# Patient Record
Sex: Male | Born: 1985 | Race: Black or African American | Hispanic: No | Marital: Married | Smoking: Never smoker
Health system: Southern US, Community
[De-identification: ages and names within clinical notes are randomized; demographics above are authoritative.]

## PROBLEM LIST (undated history)

## (undated) DIAGNOSIS — F4310 Post-traumatic stress disorder, unspecified: Secondary | ICD-10-CM

## (undated) DIAGNOSIS — F909 Attention-deficit hyperactivity disorder, unspecified type: Secondary | ICD-10-CM

## (undated) DIAGNOSIS — E785 Hyperlipidemia, unspecified: Secondary | ICD-10-CM

---

## 2007-01-04 ENCOUNTER — Emergency Department: Payer: Self-pay | Admitting: Internal Medicine

## 2007-05-11 ENCOUNTER — Emergency Department: Payer: Self-pay | Admitting: Internal Medicine

## 2011-12-08 ENCOUNTER — Emergency Department: Payer: Self-pay | Admitting: *Deleted

## 2013-10-02 ENCOUNTER — Ambulatory Visit: Payer: Self-pay | Admitting: Adult Health

## 2013-10-16 ENCOUNTER — Ambulatory Visit: Payer: Self-pay | Admitting: Adult Health

## 2014-01-29 DIAGNOSIS — F988 Other specified behavioral and emotional disorders with onset usually occurring in childhood and adolescence: Secondary | ICD-10-CM | POA: Insufficient documentation

## 2014-04-15 DIAGNOSIS — F411 Generalized anxiety disorder: Secondary | ICD-10-CM | POA: Insufficient documentation

## 2015-04-21 DIAGNOSIS — E7800 Pure hypercholesterolemia, unspecified: Secondary | ICD-10-CM | POA: Insufficient documentation

## 2018-04-25 ENCOUNTER — Other Ambulatory Visit: Payer: Self-pay | Admitting: Orthopedic Surgery

## 2018-04-25 DIAGNOSIS — M25512 Pain in left shoulder: Secondary | ICD-10-CM

## 2018-05-09 ENCOUNTER — Ambulatory Visit
Admission: RE | Admit: 2018-05-09 | Discharge: 2018-05-09 | Disposition: A | Discharge status: Home / Self Care | Payer: Commercial Managed Care - PPO | Source: Ambulatory Visit | Attending: Orthopedic Surgery | Admitting: Orthopedic Surgery

## 2018-05-09 DIAGNOSIS — M25512 Pain in left shoulder: Secondary | ICD-10-CM

## 2018-05-09 MED ORDER — LIDOCAINE HCL (PF) 1 % IJ SOLN
30.0000 mL | Freq: Once | INTRAMUSCULAR | Status: AC
  Administered 2018-05-09: 5 mL via INTRADERMAL
  Filled 2018-05-09: qty 30

## 2018-05-09 MED ORDER — GADOBENATE DIMEGLUMINE 529 MG/ML IV SOLN
5.0000 mL | Freq: Once | INTRAVENOUS | Status: AC | PRN
  Administered 2018-05-09: 0.1 mL via INTRA_ARTICULAR

## 2018-05-09 MED ORDER — IOPAMIDOL (ISOVUE-200) INJECTION 41%
50.0000 mL | Freq: Once | INTRAVENOUS | Status: AC | PRN
  Administered 2018-05-09: 15 mL
  Filled 2018-05-09: qty 50

## 2018-06-09 ENCOUNTER — Other Ambulatory Visit: Payer: Self-pay

## 2018-06-09 ENCOUNTER — Emergency Department
Admission: EM | Admit: 2018-06-09 | Discharge: 2018-06-09 | Disposition: A | Discharge status: Home / Self Care | Payer: Commercial Managed Care - PPO | Attending: Emergency Medicine | Admitting: Emergency Medicine

## 2018-06-09 ENCOUNTER — Encounter: Payer: Self-pay | Admitting: Emergency Medicine

## 2018-06-09 DIAGNOSIS — R109 Unspecified abdominal pain: Secondary | ICD-10-CM | POA: Diagnosis present

## 2018-06-09 DIAGNOSIS — R1084 Generalized abdominal pain: Secondary | ICD-10-CM | POA: Insufficient documentation

## 2018-06-09 HISTORY — DX: Hyperlipidemia, unspecified: E78.5

## 2018-06-09 HISTORY — DX: Post-traumatic stress disorder, unspecified: F43.10

## 2018-06-09 HISTORY — DX: Attention-deficit hyperactivity disorder, unspecified type: F90.9

## 2018-06-09 LAB — COMPREHENSIVE METABOLIC PANEL
ALK PHOS: 50 U/L (ref 38–126)
ALT: 40 U/L (ref 0–44)
AST: 32 U/L (ref 15–41)
Albumin: 3.5 g/dL (ref 3.5–5.0)
Anion gap: 6 (ref 5–15)
BUN: 15 mg/dL (ref 6–20)
CHLORIDE: 107 mmol/L (ref 98–111)
CO2: 26 mmol/L (ref 22–32)
CREATININE: 1.41 mg/dL — AB (ref 0.61–1.24)
Calcium: 8.6 mg/dL — ABNORMAL LOW (ref 8.9–10.3)
GFR calc Af Amer: >60 mL/min (ref >60)
Glucose, Bld: 116 mg/dL — ABNORMAL HIGH (ref 70–99)
Potassium: 4 mmol/L (ref 3.5–5.1)
Sodium: 139 mmol/L (ref 135–145)
Total Bilirubin: 0.6 mg/dL (ref 0.3–1.2)
Total Protein: 7.3 g/dL (ref 6.5–8.1)

## 2018-06-09 LAB — LIPASE, BLOOD: LIPASE: 41 U/L (ref 11–51)

## 2018-06-09 LAB — CBC
HCT: 41 % (ref 40.0–52.0)
Hemoglobin: 13.6 g/dL (ref 13.0–18.0)
MCH: 27.7 pg (ref 26.0–34.0)
MCHC: 33.2 g/dL (ref 32.0–36.0)
MCV: 83.4 fL (ref 80.0–100.0)
PLATELETS: 439 10*3/uL (ref 150–440)
RBC: 4.92 MIL/uL (ref 4.40–5.90)
RDW: 14.4 % (ref 11.5–14.5)
WBC: 12.3 10*3/uL — ABNORMAL HIGH (ref 3.8–10.6)

## 2018-06-09 MED ORDER — DICYCLOMINE HCL 20 MG PO TABS: 20.0000 mg | ORAL_TABLET | Freq: Three times a day (TID) | ORAL | 0 refills | Status: AC | PRN

## 2018-06-09 MED ORDER — DICYCLOMINE HCL 10 MG/ML IM SOLN
20.0000 mg | Freq: Once | INTRAMUSCULAR | Status: DC
  Filled 2018-06-09: qty 2

## 2018-06-09 NOTE — ED Notes (Signed)
Pt states low abdominal pain that has been constant, states it worsens with stress. Appears in NAD.

## 2018-06-09 NOTE — ED Triage Notes (Addendum)
Pt arrived via POV with reports low abdominal pain below navel and bright red bleeding when having a BM. PT had an endoscopy about a week ago.  Pt denies any black or tarry stools.  No distress noted in triage.

## 2018-06-09 NOTE — Discharge Instructions (Addendum)
Please seek medical attention for any high fevers, chest pain, shortness of breath, change in behavior, persistent vomiting, bloody stool or any other new or concerning symptoms.  

## 2018-06-09 NOTE — ED Provider Notes (Signed)
[facility] Emergency Department Provider Note  ____________________________________________   I have reviewed the triage vital signs and the nursing notes.   HISTORY  Chief Complaint Abdominal Pain   History limited by: Not Limited   HPI Norman Abbott is a 32 y.o. male who presents to the emergency department today because of concerns for abdominal pain and GI bleed.  Patient states that he started developing abdominal pain a few days ago.  Is located throughout the central abdomen.  Has been fairly constant.  He states he has had somewhat loose stools and has now noticed blood in his stools.  It is red blood.  Patient has not had any vomiting.  He denies having similar symptoms in the past.  Denies history of IBS Crohn's or other similar pathology.  Denies any unusual ingestions recently.  Denies any recent travel.   Per medical record review patient has a history of HLD  Past Medical History:  Diagnosis Date  . ADHD   . Hyperlipidemia   . PTSD (post-traumatic stress disorder)     There are no active problems to display for this patient.   History reviewed. No pertinent surgical history.  Prior to Admission medications   Not on File    Allergies Patient has no known allergies.  History reviewed. No pertinent family history.  Social History Social History   Tobacco Use  . Smoking status: Never Smoker  . Smokeless tobacco: Never Used  Substance Use Topics  . Alcohol use: Not on file  . Drug use: Not on file    Review of Systems Constitutional: No fever/chills Eyes: No visual changes. ENT: No sore throat. Cardiovascular: Denies chest pain. Respiratory: Denies shortness of breath. Gastrointestinal: Positive for abdominal pain, rectal bleeding. Genitourinary: Negative for dysuria. Musculoskeletal: Negative for back pain. Skin: Negative for rash. Neurological: Negative for headaches, focal weakness or  numbness.  ____________________________________________   PHYSICAL EXAM:  VITAL SIGNS: ED Triage Vitals  Enc Vitals Group     BP 06/09/18 1714 (!) 146/76     Pulse Rate 06/09/18 1714 99     Resp 06/09/18 1714 18     Temp 06/09/18 1714 98.2 F (36.8 C)     Temp Source 06/09/18 1714 Oral     SpO2 06/09/18 1714 100 %     Weight 06/09/18 1714 183 lb (83 kg)     Height 06/09/18 1714 5\' 6"  (1.676 m)     Head Circumference --      Peak Flow --      Pain Score 06/09/18 1726 7   Constitutional: Alert and oriented.  Eyes: Conjunctivae are normal.  ENT      Head: Normocephalic and atraumatic.      Nose: No congestion/rhinnorhea.      Mouth/Throat: Mucous membranes are moist.      Neck: No stridor. Hematological/Lymphatic/Immunilogical: No cervical lymphadenopathy. Cardiovascular: Normal rate, regular rhythm.  No murmurs, rubs, or gallops.  Respiratory: Normal respiratory effort without tachypnea nor retractions. Breath sounds are clear and equal bilaterally. No wheezes/rales/rhonchi. Gastrointestinal: Soft and somewhat tender periumbilically. No rebound. No guarding.  Genitourinary: Deferred Musculoskeletal: Normal range of motion in all extremities. No lower extremity edema. Neurologic:  Normal speech and language. No gross focal neurologic deficits are appreciated.  Skin:  Skin is warm, dry and intact. No rash noted. Psychiatric: Mood and affect are normal. Speech and behavior are normal. Patient exhibits appropriate insight and judgment.  ____________________________________________    LABS (pertinent positives/negatives)  CBC  wbc 12.3, hgb 13.6, plt 439 Lipase 41 CMP na 139, k 4.0, glu 116, cr 1.41  ____________________________________________   EKG  None  ____________________________________________    RADIOLOGY  None  ____________________________________________   PROCEDURES  Procedures  ____________________________________________   INITIAL  IMPRESSION / ASSESSMENT AND PLAN / ED COURSE  Pertinent labs & imaging results that were available during my care of the patient were reviewed by me and considered in my medical decision making (see chart for details).   Patient presented to the emergency department today because of concerns for abdominal pain and GI bleed.  Differential would be broad including food poisoning gastroenteritis, diverticulitis, appendicitis amongst other etiologies.  I did discuss my concern with the patient.  Mild leukocytosis and the blood work.  Did order CT scan.  However patient stated he would like to be discharged prior to CT scan be performed.  Discussed importance of returning for worsening symptoms.  Will give patient Bentyl in hopes of helping alleviate some of his symptoms.  ____________________________________________   FINAL CLINICAL IMPRESSION(S) / ED DIAGNOSES  Final diagnoses:  Generalized abdominal pain     Note: This dictation was prepared with Dragon dictation. Any transcriptional errors that result from this process are unintentional     Phineas Semen, MD 06/09/18 2013

## 2018-06-09 NOTE — ED Notes (Signed)
Pt ambulatory to POV without difficulty. VSS, NAD. Discharge instructions, RX and follow up reviewed. All questions and concerns addressed. Pt requested to go home not get medication.

## 2019-04-07 ENCOUNTER — Other Ambulatory Visit: Payer: Self-pay

## 2019-04-07 ENCOUNTER — Ambulatory Visit: Payer: Commercial Managed Care - PPO | Admitting: Physician Assistant

## 2019-04-15 ENCOUNTER — Ambulatory Visit (INDEPENDENT_AMBULATORY_CARE_PROVIDER_SITE_OTHER): Payer: Commercial Managed Care - PPO | Admitting: Adult Health

## 2019-04-15 ENCOUNTER — Other Ambulatory Visit: Payer: Self-pay

## 2019-04-15 VITALS — BP 163/91 | HR 105 | Ht 66.0 in | Wt 185.0 lb

## 2019-04-15 DIAGNOSIS — F909 Attention-deficit hyperactivity disorder, unspecified type: Secondary | ICD-10-CM | POA: Diagnosis not present

## 2019-04-15 DIAGNOSIS — F319 Bipolar disorder, unspecified: Secondary | ICD-10-CM | POA: Diagnosis not present

## 2019-04-15 DIAGNOSIS — F4310 Post-traumatic stress disorder, unspecified: Secondary | ICD-10-CM | POA: Diagnosis not present

## 2019-04-15 DIAGNOSIS — F411 Generalized anxiety disorder: Secondary | ICD-10-CM | POA: Diagnosis not present

## 2019-04-15 DIAGNOSIS — G4700 Insomnia, unspecified: Secondary | ICD-10-CM
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
## 2019-04-29 ENCOUNTER — Encounter: Payer: Self-pay | Admitting: Adult Health

## 2019-04-29 ENCOUNTER — Other Ambulatory Visit: Payer: Self-pay

## 2019-04-29 ENCOUNTER — Ambulatory Visit (INDEPENDENT_AMBULATORY_CARE_PROVIDER_SITE_OTHER): Payer: Commercial Managed Care - PPO | Admitting: Adult Health

## 2019-04-29 DIAGNOSIS — F319 Bipolar disorder, unspecified: Secondary | ICD-10-CM

## 2019-04-29 MED ORDER — ARIPIPRAZOLE 5 MG PO TABS: 5.0000 mg | ORAL_TABLET | Freq: Every day | ORAL | 2 refills | Status: DC

## 2019-04-29 NOTE — Progress Notes (Addendum)
Norman DarkDavid L Rizor [id number] 02/20/1986 33 y.o.  Subjective:   Patient ID:  Norman Abbott is a 33 y.o. (DOB 06/11/1986) male.  Chief Complaint:  Chief Complaint  Patient presents with  . ADHD  . Depression  . Anxiety  . Manic Behavior  . Insomnia    HPI Norman Abbott presents to the office today for follow-up of depression, anxiety, insomnia, bipolar disorder, and ADHD.  Describes mood today as "ok". Pleasant. Mood symptoms - reports some depression, anxiety, and irritability. Needing things in place and in order. Upset this morning because of running late. Stating "I'm doing a little better than I was". Still having issues with "short fuse". Overreacting at times. Wife also notes periods of "outbursts". Discussing previous affair and issues arising from the break up. Wife also adding more history about their relationship this morning. Some discussion about her parents "not accepting him or their relationship". Asking about testing for "Autism". Is open to adding a mood stabilizer. Stable interest and motivation. Taking medications as prescribed.  Energy levels stable. Active, has a regular exercise routine. Works full time but states "it sucks".  Enjoys some usual interests and activities. Spending time with family - wife and children. Appetite adequate. Weight stable. Sleeps better some nights than others - feels "restless". Averages 4 to 6 hours a night.  Focus and concentration mostly stable with Adderall. Completing tasks. Managing aspects of household.  Denies SI or HI. Denies AH or VH.   Review of Systems:  Review of Systems  Musculoskeletal: Negative for gait problem.  Neurological: Negative for tremors.  Psychiatric/Behavioral:       Please refer to HPI    Medications: I have reviewed the patient's current medications.  Current Outpatient Medications  Medication Sig Dispense Refill  . amphetamine-dextroamphetamine (ADDERALL XR) 20 MG 24 hr capsule Take by mouth.    Marland Kitchen.  amphetamine-dextroamphetamine (ADDERALL XR) 30 MG 24 hr capsule Take by mouth.    Marland Kitchen. atorvastatin (LIPITOR) 10 MG tablet TAKE 1 TABLET BY MOUTH ONCE DAILY    . escitalopram (LEXAPRO) 10 MG tablet TAKE 1 AND 1/2 TABLETS BY  MOUTH ONCE A DAY    . LORazepam (ATIVAN) 0.5 MG tablet Take by mouth.    . meloxicam (MOBIC) 15 MG tablet Take by mouth.    . ARIPiprazole (ABILIFY) 5 MG tablet Take 1 tablet (5 mg total) by mouth daily. 30 tablet 2  . dicyclomine (BENTYL) 20 MG tablet Take 1 tablet (20 mg total) by mouth 3 (three) times daily as needed (abdominal pain). 30 tablet 0   No current facility-administered medications for this visit.     Medication Side Effects: None  Allergies: No Known Allergies  Past Medical History:  Diagnosis Date  . ADHD   . Hyperlipidemia   . PTSD (post-traumatic stress disorder)     History reviewed. No pertinent family history.  Social History   Socioeconomic History  . Marital status: Single    Spouse name: Not on file  . Number of children: Not on file  . Years of education: Not on file  . Highest education level: Not on file  Occupational History  . Not on file  Social Needs  . Financial resource strain: Not on file  . Food insecurity    Worry: Not on file    Inability: Not on file  . Transportation needs    Medical: Not on file    Non-medical: Not on file  Tobacco Use  . Smoking status: Never  Smoker  . Smokeless tobacco: Never Used  Substance and Sexual Activity  . Alcohol use: Not on file  . Drug use: Not on file  . Sexual activity: Not on file  Lifestyle  . Physical activity    Days per week: Not on file    Minutes per session: Not on file  . Stress: Not on file  Relationships  . Social Herbalist on phone: Not on file    Gets together: Not on file    Attends religious service: Not on file    Active member of club or organization: Not on file    Attends meetings of clubs or organizations: Not on file    Relationship  status: Not on file  . Intimate partner violence    Fear of current or ex partner: Not on file    Emotionally abused: Not on file    Physically abused: Not on file    Forced sexual activity: Not on file  Other Topics Concern  . Not on file  Social History Narrative  . Not on file    Past Medical History, Surgical history, Social history, and Family history were reviewed and updated as appropriate.   Please see review of systems for further details on the patient's review from today.   Objective:   Physical Exam:  There were no vitals taken for this visit.  Physical Exam Constitutional:      General: He is not in acute distress.    Appearance: He is well-developed.  Musculoskeletal:        General: No deformity.  Neurological:     Mental Status: He is alert and oriented to person, place, and time.     Coordination: Coordination normal.  Psychiatric:        Attention and Perception: Attention and perception normal. He does not perceive auditory or visual hallucinations.        Mood and Affect: Mood is anxious and depressed. Affect is not labile, blunt, angry or inappropriate.        Speech: Speech normal.        Behavior: Behavior is agitated.        Thought Content: Thought content normal. Thought content is not paranoid or delusional. Thought content does not include homicidal or suicidal ideation. Thought content does not include homicidal or suicidal plan.        Cognition and Memory: Cognition and memory normal.        Judgment: Judgment normal.     Comments: Insight intact     Lab Review:     Component Value Date/Time   NA 139 06/09/2018 1715   K 4.0 06/09/2018 1715   CL 107 06/09/2018 1715   CO2 26 06/09/2018 1715   GLUCOSE 116 (H) 06/09/2018 1715   BUN 15 06/09/2018 1715   CREATININE 1.41 (H) 06/09/2018 1715   CALCIUM 8.6 (L) 06/09/2018 1715   PROT 7.3 06/09/2018 1715   ALBUMIN 3.5 06/09/2018 1715   AST 32 06/09/2018 1715   ALT 40 06/09/2018 1715    ALKPHOS 50 06/09/2018 1715   BILITOT 0.6 06/09/2018 1715   GFRNONAA >60 06/09/2018 1715   GFRAA >60 06/09/2018 1715       Component Value Date/Time   WBC 12.3 (H) 06/09/2018 1715   RBC 4.92 06/09/2018 1715   HGB 13.6 06/09/2018 1715   HCT 41.0 06/09/2018 1715   PLT 439 06/09/2018 1715   MCV 83.4 06/09/2018 1715   MCH 27.7 06/09/2018 1715  MCHC 33.2 06/09/2018 1715   RDW 14.4 06/09/2018 1715    No results found for: POCLITH, LITHIUM   No results found for: PHENYTOIN, PHENOBARB, VALPROATE, CBMZ   .res Assessment: Plan:    Plan:  1. Add Abilify 5mg  - 1/2 tab at hs x 7 nights, then one tab at hs. 2. Continue lexapro 15mg  daily. 3. Continue Adderall XR 30mg  in the morning, and Adderall XR 20mg  daily 4. Continue therapy 5. Psychological  Evaluation - rule out autism  RTC 4 weeks  Norman HuaDavid was seen today for adhd, depression, anxiety, manic behavior and insomnia.  Diagnoses and all orders for this visit:  Bipolar I disorder (HCC) -     ARIPiprazole (ABILIFY) 5 MG tablet; Take 1 tablet (5 mg total) by mouth daily.   Greater than 50% of face to face time with patient was spent on counseling and coordination of care. We discussed psychological testing to rule out Autism, resources for ADHD - ADDitude website, supplementations indicated for ADHD, and marriage counseling.  Please see After Visit Summary for patient specific instructions.  No future appointments.  No orders of the defined types were placed in this encounter.   -------------------------------

## 2019-04-29 NOTE — Addendum Note (Signed)
Addended by: Aloha Gell on: 04/29/2019 10:06 AM   Modules accepted: Level of Service

## 2019-05-02 ENCOUNTER — Encounter: Payer: Self-pay | Admitting: Adult Health
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
## 2019-05-19 ENCOUNTER — Other Ambulatory Visit: Payer: Self-pay

## 2019-05-19 ENCOUNTER — Telehealth: Payer: Self-pay | Admitting: Adult Health

## 2019-05-19 DIAGNOSIS — F902 Attention-deficit hyperactivity disorder, combined type: Secondary | ICD-10-CM

## 2019-05-19 MED ORDER — AMPHETAMINE-DEXTROAMPHET ER 30 MG PO CP24: 30.0000 mg | ORAL_CAPSULE | Freq: Every day | ORAL | 0 refills | Status: DC

## 2019-05-19 MED ORDER — AMPHETAMINE-DEXTROAMPHET ER 20 MG PO CP24: 20.0000 mg | ORAL_CAPSULE | Freq: Every day | ORAL | 0 refills | Status: DC

## 2019-05-19 MED ORDER — AMPHETAMINE-DEXTROAMPHETAMINE 20 MG PO TABS: 20.0000 mg | ORAL_TABLET | Freq: Every day | ORAL | 0 refills | Status: DC

## 2019-05-19 NOTE — Telephone Encounter (Signed)
Adderall 20 mg and Adderall 30mg  - both need refills [facility]  [address] ( not in [facility]) in [place], [place] .

## 2019-05-19 NOTE — Telephone Encounter (Signed)
Pt is in need of Adderall 20mg  regular ( not extended release)- The Adderall 30mg  xr is correct.  Can we please correct the 20mg  to the [facility]?

## 2019-05-19 NOTE — Progress Notes (Signed)
Patient currently taking Adderall XR 30 mg and Adderall IR 20 mg, updated medication list.

## 2019-05-20 ENCOUNTER — Other Ambulatory Visit: Payer: Self-pay

## 2019-05-20 ENCOUNTER — Encounter: Payer: Self-pay | Admitting: Adult Health

## 2019-05-20 ENCOUNTER — Ambulatory Visit (INDEPENDENT_AMBULATORY_CARE_PROVIDER_SITE_OTHER): Payer: Commercial Managed Care - PPO | Admitting: Adult Health

## 2019-05-20 DIAGNOSIS — F3162 Bipolar disorder, current episode mixed, moderate: Secondary | ICD-10-CM

## 2019-05-20 DIAGNOSIS — F4310 Post-traumatic stress disorder, unspecified: Secondary | ICD-10-CM | POA: Diagnosis not present

## 2019-05-20 DIAGNOSIS — F411 Generalized anxiety disorder: Secondary | ICD-10-CM

## 2019-05-20 DIAGNOSIS — G4700 Insomnia, unspecified: Secondary | ICD-10-CM

## 2019-05-20 DIAGNOSIS — F909 Attention-deficit hyperactivity disorder, unspecified type: Secondary | ICD-10-CM

## 2019-05-20 MED ORDER — ESZOPICLONE 3 MG PO TABS: 3.0000 mg | ORAL_TABLET | Freq: Every day | ORAL | 2 refills | Status: DC

## 2019-05-20 NOTE — Progress Notes (Signed)
Norman Abbott [id number] 05/08/1986 33 y.o.  Subjective:   Patient ID:  Norman Abbott is a 33 y.o. (DOB 01/06/1986) male.  Chief Complaint:  No chief complaint on file.   HPI Norman Abbott presents to the office today for follow-up of depression, anxiety, insomnia, bipolar disorder, and ADHD.  Describes mood today as "ok". Pleasant. Mood symptoms - denies depression, anxiety, and irritability. Stating "I'm doing pretty good". Moods are "better". Not having "outburts". Not as "short fused or overreacting". Also stating "I have the negative feelings, but can control them now". Feels "steady" throughout the day. Stable interest and motivation. Taking medications as prescribed.  Energy levels stable. Active, has a regular exercise routine. Works full time.  Enjoys some usual interests and activities. Spending time with family - wife and children. Appetite adequate. Weight stable. Sleeps better some nights than others. Averages 4 to 5 "at the most". Feels like Abilify activates him at night - wanting to move to morning.  Focus and concentration stable with Adderall. Completing tasks. Managing aspects of household.  Denies SI or HI. Denies AH or VH.   Review of Systems:  Review of Systems  Musculoskeletal: Negative for gait problem.  Neurological: Negative for tremors.  Psychiatric/Behavioral:       Please refer to HPI    Medications: I have reviewed the patient's current medications.  Current Outpatient Medications  Medication Sig Dispense Refill  . amphetamine-dextroamphetamine (ADDERALL XR) 30 MG 24 hr capsule Take 1 capsule (30 mg total) by mouth daily. 30 capsule 0  . amphetamine-dextroamphetamine (ADDERALL) 20 MG tablet Take 1 tablet (20 mg total) by mouth daily. 30 tablet 0  . ARIPiprazole (ABILIFY) 5 MG tablet Take 1 tablet (5 mg total) by mouth daily. 30 tablet 2  . atorvastatin (LIPITOR) 10 MG tablet TAKE 1 TABLET BY MOUTH ONCE DAILY    . dicyclomine (BENTYL) 20 MG tablet  Take 1 tablet (20 mg total) by mouth 3 (three) times daily as needed (abdominal pain). 30 tablet 0  . escitalopram (LEXAPRO) 10 MG tablet TAKE 1 AND 1/2 TABLETS BY  MOUTH ONCE A DAY    . Eszopiclone (ESZOPICLONE) 3 MG TABS Take 1 tablet (3 mg total) by mouth at bedtime. Take immediately before bedtime 30 tablet 2  . LORazepam (ATIVAN) 0.5 MG tablet Take by mouth.    . meloxicam (MOBIC) 15 MG tablet Take by mouth.     No current facility-administered medications for this visit.     Medication Side Effects: None  Allergies: No Known Allergies  Past Medical History:  Diagnosis Date  . ADHD   . Hyperlipidemia   . PTSD (post-traumatic stress disorder)     No family history on file.  Social History   Socioeconomic History  . Marital status: Single    Spouse name: Not on file  . Number of children: Not on file  . Years of education: Not on file  . Highest education level: Not on file  Occupational History  . Not on file  Social Needs  . Financial resource strain: Not on file  . Food insecurity    Worry: Not on file    Inability: Not on file  . Transportation needs    Medical: Not on file    Non-medical: Not on file  Tobacco Use  . Smoking status: Never Smoker  . Smokeless tobacco: Never Used  Substance and Sexual Activity  . Alcohol use: Not on file  . Drug use: Not on file  .  Sexual activity: Not on file  Lifestyle  . Physical activity    Days per week: Not on file    Minutes per session: Not on file  . Stress: Not on file  Relationships  . Social Musician on phone: Not on file    Gets together: Not on file    Attends religious service: Not on file    Active member of club or organization: Not on file    Attends meetings of clubs or organizations: Not on file    Relationship status: Not on file  . Intimate partner violence    Fear of current or ex partner: Not on file    Emotionally abused: Not on file    Physically abused: Not on file    Forced  sexual activity: Not on file  Other Topics Concern  . Not on file  Social History Narrative  . Not on file    Past Medical History, Surgical history, Social history, and Family history were reviewed and updated as appropriate.   Please see review of systems for further details on the patient's review from today.   Objective:   Physical Exam:  There were no vitals taken for this visit.  Physical Exam Constitutional:      General: He is not in acute distress.    Appearance: He is well-developed.  Musculoskeletal:        General: No deformity.  Neurological:     Mental Status: He is alert and oriented to person, place, and time.     Coordination: Coordination normal.  Psychiatric:        Attention and Perception: Attention and perception normal. He does not perceive auditory or visual hallucinations.        Mood and Affect: Mood is not anxious or depressed. Affect is not labile, blunt, angry or inappropriate.        Speech: Speech normal.        Behavior: Behavior is not agitated.        Thought Content: Thought content normal. Thought content is not paranoid or delusional. Thought content does not include homicidal or suicidal ideation. Thought content does not include homicidal or suicidal plan.        Cognition and Memory: Cognition and memory normal.        Judgment: Judgment normal.     Comments: Insight intact     Lab Review:     Component Value Date/Time   NA 139 06/09/2018 1715   K 4.0 06/09/2018 1715   CL 107 06/09/2018 1715   CO2 26 06/09/2018 1715   GLUCOSE 116 (H) 06/09/2018 1715   BUN 15 06/09/2018 1715   CREATININE 1.41 (H) 06/09/2018 1715   CALCIUM 8.6 (L) 06/09/2018 1715   PROT 7.3 06/09/2018 1715   ALBUMIN 3.5 06/09/2018 1715   AST 32 06/09/2018 1715   ALT 40 06/09/2018 1715   ALKPHOS 50 06/09/2018 1715   BILITOT 0.6 06/09/2018 1715   GFRNONAA >60 06/09/2018 1715   GFRAA >60 06/09/2018 1715       Component Value Date/Time   WBC 12.3 (H)  06/09/2018 1715   RBC 4.92 06/09/2018 1715   HGB 13.6 06/09/2018 1715   HCT 41.0 06/09/2018 1715   PLT 439 06/09/2018 1715   MCV 83.4 06/09/2018 1715   MCH 27.7 06/09/2018 1715   MCHC 33.2 06/09/2018 1715   RDW 14.4 06/09/2018 1715    No results found for: POCLITH, LITHIUM   No results found  for: PHENYTOIN, PHENOBARB, VALPROATE, CBMZ   .res Assessment: Plan:    Plan:  Continue Abilify 5mg  daily - move to morning. Continue Lexapro 15mg  daily. Continue Adderall XR 30mg  in the morning and Adderall 20mg  daily Add Lunesta 3mg  at hs to target sleep.  Continue therapy  Psychological  Evaluation - rule out autism  RTC 4 weeks  Diagnoses and all orders for this visit:  Generalized anxiety disorder  PTSD (post-traumatic stress disorder)  Bipolar 1 disorder, mixed, moderate (HCC)  Insomnia, unspecified type  Attention deficit hyperactivity disorder (ADHD), unspecified ADHD type  Other orders -     Eszopiclone (ESZOPICLONE) 3 MG TABS; Take 1 tablet (3 mg total) by mouth at bedtime. Take immediately before bedtime   Greater than 50% of face to face time with patient was spent on counseling and coordination of care. We discussed psychological testing to rule out Autism, resources for ADHD - ADDitude website, supplementations indicated for ADHD, and marriage counseling.  Please see After Visit Summary for patient specific instructions.  No future appointments.  No orders of the defined types were placed in this encounter.   -------------------------------

## 2019-06-12 ENCOUNTER — Telehealth: Payer: Self-pay | Admitting: Adult Health

## 2019-06-12 ENCOUNTER — Other Ambulatory Visit: Payer: Self-pay

## 2019-06-12 DIAGNOSIS — F902 Attention-deficit hyperactivity disorder, combined type: Secondary | ICD-10-CM

## 2019-06-12 NOTE — Telephone Encounter (Signed)
Last refill for both on 05/19/2019, due 06/16/2019 pended for approval

## 2019-06-12 NOTE — Telephone Encounter (Signed)
Pt called to request refill on both Adderalls at [place]

## 2019-06-15 MED ORDER — AMPHETAMINE-DEXTROAMPHETAMINE 20 MG PO TABS: 20.0000 mg | ORAL_TABLET | Freq: Every day | ORAL | 0 refills | Status: DC

## 2019-06-15 MED ORDER — AMPHETAMINE-DEXTROAMPHET ER 30 MG PO CP24: 30.0000 mg | ORAL_CAPSULE | Freq: Every day | ORAL | 0 refills | Status: DC

## 2019-07-15 ENCOUNTER — Other Ambulatory Visit: Payer: Self-pay

## 2019-07-15 ENCOUNTER — Telehealth: Payer: Self-pay | Admitting: Adult Health

## 2019-07-15 DIAGNOSIS — F902 Attention-deficit hyperactivity disorder, combined type: Secondary | ICD-10-CM

## 2019-07-15 MED ORDER — AMPHETAMINE-DEXTROAMPHETAMINE 20 MG PO TABS: 20.0000 mg | ORAL_TABLET | Freq: Every day | ORAL | 0 refills | Status: DC

## 2019-07-15 MED ORDER — AMPHETAMINE-DEXTROAMPHET ER 30 MG PO CP24: 30.0000 mg | ORAL_CAPSULE | Freq: Every day | ORAL | 0 refills | Status: DC

## 2019-07-15 NOTE — Telephone Encounter (Signed)
Patient called and said that he needs a refill on his adderrall 20 mg and his adderrall 30 mg xr to  [facility] [facility] [place]

## 2019-07-15 NOTE — Telephone Encounter (Signed)
Last refill 10/06 for both, pended for approval. Last visit 05/20/2019

## 2019-07-25 ENCOUNTER — Other Ambulatory Visit: Payer: Self-pay | Admitting: Adult Health

## 2019-07-25 DIAGNOSIS — F319 Bipolar disorder, unspecified: Secondary | ICD-10-CM

## 2019-08-13 ENCOUNTER — Other Ambulatory Visit: Payer: Self-pay

## 2019-08-13 ENCOUNTER — Telehealth: Payer: Self-pay | Admitting: Adult Health

## 2019-08-13 DIAGNOSIS — F902 Attention-deficit hyperactivity disorder, combined type: Secondary | ICD-10-CM

## 2019-08-13 DIAGNOSIS — F319 Bipolar disorder, unspecified: Secondary | ICD-10-CM

## 2019-08-13 NOTE — Telephone Encounter (Signed)
Pt requesting a refill on his Adderall. Fill at the  [facility] in [place].

## 2019-08-13 NOTE — Telephone Encounter (Signed)
Last refill 07/15/2019 for both strengths of Adderall Pended for approval Last visit 05/2019 nothing scheduled at this time

## 2019-08-17 MED ORDER — AMPHETAMINE-DEXTROAMPHET ER 30 MG PO CP24: 30.0000 mg | ORAL_CAPSULE | Freq: Every day | ORAL | 0 refills | Status: DC

## 2019-08-17 MED ORDER — AMPHETAMINE-DEXTROAMPHETAMINE 20 MG PO TABS: 20.0000 mg | ORAL_TABLET | Freq: Every day | ORAL | 0 refills | Status: DC

## 2019-08-17 NOTE — Telephone Encounter (Signed)
Norman Abbott called again today to ask for the refill of both his Adderalls.

## 2019-09-07 ENCOUNTER — Ambulatory Visit (INDEPENDENT_AMBULATORY_CARE_PROVIDER_SITE_OTHER): Payer: Commercial Managed Care - PPO | Admitting: Adult Health

## 2019-09-07 ENCOUNTER — Encounter: Payer: Self-pay | Admitting: Adult Health

## 2019-09-07 DIAGNOSIS — F319 Bipolar disorder, unspecified: Secondary | ICD-10-CM

## 2019-09-07 DIAGNOSIS — F411 Generalized anxiety disorder: Secondary | ICD-10-CM

## 2019-09-07 DIAGNOSIS — G4700 Insomnia, unspecified: Secondary | ICD-10-CM | POA: Diagnosis not present

## 2019-09-07 DIAGNOSIS — F902 Attention-deficit hyperactivity disorder, combined type: Secondary | ICD-10-CM

## 2019-09-07 DIAGNOSIS — F331 Major depressive disorder, recurrent, moderate: Secondary | ICD-10-CM

## 2019-09-07 MED ORDER — AMPHETAMINE-DEXTROAMPHETAMINE 15 MG PO TABS: 15.0000 mg | ORAL_TABLET | Freq: Every day | ORAL | 0 refills | Status: DC

## 2019-09-07 MED ORDER — AMPHETAMINE-DEXTROAMPHET ER 30 MG PO CP24: 30.0000 mg | ORAL_CAPSULE | Freq: Every day | ORAL | 0 refills | Status: DC

## 2019-09-07 MED ORDER — ESZOPICLONE 3 MG PO TABS: 3.0000 mg | ORAL_TABLET | Freq: Every day | ORAL | 2 refills | Status: DC

## 2019-09-07 MED ORDER — ESCITALOPRAM OXALATE 10 MG PO TABS: ORAL_TABLET | ORAL | 1 refills | Status: DC

## 2019-09-07 MED ORDER — ARIPIPRAZOLE 5 MG PO TABS: 5.0000 mg | ORAL_TABLET | Freq: Every day | ORAL | 1 refills | Status: DC

## 2019-09-07 NOTE — Progress Notes (Signed)
Norman Abbott [id number] 07-26-1986 33 y.o.  Virtual Visit via Telephone Note  I connected with pt on 09/07/19 at  9:00 AM EST by telephone and verified that I am speaking with the correct person using two identifiers.   I discussed the limitations, risks, security and privacy concerns of performing an evaluation and management service by telephone and the availability of in person appointments. I also discussed with the patient that there may be a patient responsible charge related to this service. The patient expressed understanding and agreed to proceed.   I discussed the assessment and treatment plan with the patient. The patient was provided an opportunity to ask questions and all were answered. The patient agreed with the plan and demonstrated an understanding of the instructions.   The patient was advised to call back or seek an in-person evaluation if the symptoms worsen or if the condition fails to improve as anticipated.  I provided 30 minutes of non-face-to-face time during this encounter.  The patient was located at home.  The provider was located at [place].   Aloha Gell, NP   Subjective:   Patient ID:  [name] is a 33 y.o. (DOB Feb 09, 1986) male.  Chief Complaint:  Chief Complaint  Patient presents with  . ADHD  . Anxiety  . Depression  . Insomnia  . Other    Bipolar disorder    HPI Norman Abbott presents for follow-up of depression, anxiety, insomnia, bipolar disorder, and ADHD.  Describes mood today as "ok". Pleasant. Mood symptoms - denies depression, anxiety, and irritability. Stating "things are going good for me". Denies "mood" instability. Denies "outbursts". Able to "manage" moods. Able to "let things go easier". Feels like medications are working well. He and family doing well and had a good holiday. Stable interest and motivation. Taking medications as prescribed.  Energy levels stable. Active, has a regular exercise routine.  Works full time.  Enjoys some usual interests and activities. Married. Spending time with family - wife and children. Appetite adequate. Weight stable. Sleeps well most nights with addition of Lunesta. Averages 6 to 7 hours. Focus and concentration stable. Work going well. Completing tasks. Managing aspects of household.  Denies SI or HI. Denies AH or VH.  Review of Systems:  Review of Systems  Musculoskeletal: Negative for gait problem.  Neurological: Negative for tremors.  Psychiatric/Behavioral:       Please refer to HPI    Medications: I have reviewed the patient's current medications.  Current Outpatient Medications  Medication Sig Dispense Refill  . amphetamine-dextroamphetamine (ADDERALL XR) 30 MG 24 hr capsule Take 1 capsule (30 mg total) by mouth daily. 30 capsule 0  . [START ON 10/05/2019] amphetamine-dextroamphetamine (ADDERALL XR) 30 MG 24 hr capsule Take 1 capsule (30 mg total) by mouth daily. 30 capsule 0  . [START ON 11/02/2019] amphetamine-dextroamphetamine (ADDERALL XR) 30 MG 24 hr capsule Take 1 capsule (30 mg total) by mouth daily. 30 capsule 0  . amphetamine-dextroamphetamine (ADDERALL) 15 MG tablet Take 1 tablet by mouth daily before breakfast. 30 tablet 0  . [START ON 10/07/2019] amphetamine-dextroamphetamine (ADDERALL) 15 MG tablet Take 1 tablet by mouth daily before breakfast. 30 tablet 0  . [START ON 11/06/2019] amphetamine-dextroamphetamine (ADDERALL) 15 MG tablet Take 1 tablet by mouth daily before breakfast. 30 tablet 0  . amphetamine-dextroamphetamine (ADDERALL) 20 MG tablet Take 1 tablet (20 mg total) by mouth daily. 30 tablet 0  . ARIPiprazole (ABILIFY) 5 MG tablet Take 1 tablet (5 mg  total) by mouth daily. 90 tablet 1  . atorvastatin (LIPITOR) 10 MG tablet TAKE 1 TABLET BY MOUTH ONCE DAILY    . dicyclomine (BENTYL) 20 MG tablet Take 1 tablet (20 mg total) by mouth 3 (three) times daily as needed (abdominal pain). 30 tablet 0  . escitalopram (LEXAPRO) 10 MG  tablet TAKE 1 AND 1/2 TABLETS BY  MOUTH ONCE A DAY 135 tablet 1  . Eszopiclone (ESZOPICLONE) 3 MG TABS Take 1 tablet (3 mg total) by mouth at bedtime. Take immediately before bedtime 30 tablet 2  . LORazepam (ATIVAN) 0.5 MG tablet Take by mouth.    . meloxicam (MOBIC) 15 MG tablet Take by mouth.     No current facility-administered medications for this visit.    Medication Side Effects: None  Allergies: No Known Allergies  Past Medical History:  Diagnosis Date  . ADHD   . Hyperlipidemia   . PTSD (post-traumatic stress disorder)     No family history on file.  Social History   Socioeconomic History  . Marital status: Single    Spouse name: Not on file  . Number of children: Not on file  . Years of education: Not on file  . Highest education level: Not on file  Occupational History  . Not on file  Tobacco Use  . Smoking status: Never Smoker  . Smokeless tobacco: Never Used  Substance and Sexual Activity  . Alcohol use: Not on file  . Drug use: Not on file  . Sexual activity: Not on file  Other Topics Concern  . Not on file  Social History Narrative  . Not on file   Social Determinants of Health   Financial Resource Strain:   . Difficulty of Paying Living Expenses: Not on file  Food Insecurity:   . Worried About Programme researcher, broadcasting/film/videounning Out of Food in the Last Year: Not on file  . Ran Out of Food in the Last Year: Not on file  Transportation Needs:   . Lack of Transportation (Medical): Not on file  . Lack of Transportation (Non-Medical): Not on file  Physical Activity:   . Days of Exercise per Week: Not on file  . Minutes of Exercise per Session: Not on file  Stress:   . Feeling of Stress : Not on file  Social Connections:   . Frequency of Communication with Friends and Family: Not on file  . Frequency of Social Gatherings with Friends and Family: Not on file  . Attends Religious Services: Not on file  . Active Member of Clubs or Organizations: Not on file  . Attends Occupational hygienistClub  or Organization Meetings: Not on file  . Marital Status: Not on file  Intimate Partner Violence:   . Fear of Current or Ex-Partner: Not on file  . Emotionally Abused: Not on file  . Physically Abused: Not on file  . Sexually Abused: Not on file    Past Medical History, Surgical history, Social history, and Family history were reviewed and updated as appropriate.   Please see review of systems for further details on the patient's review from today.   Objective:   Physical Exam:  There were no vitals taken for this visit.  Physical Exam Neurological:     Mental Status: He is alert and oriented to person, place, and time.     Cranial Nerves: No dysarthria.  Psychiatric:        Attention and Perception: Attention and perception normal.        Mood and Affect:  Mood normal.        Speech: Speech normal.        Behavior: Behavior is cooperative.        Thought Content: Thought content normal. Thought content is not paranoid or delusional. Thought content does not include homicidal or suicidal ideation. Thought content does not include homicidal or suicidal plan.        Cognition and Memory: Cognition and memory normal.        Judgment: Judgment normal.     Comments: Insight intact     Lab Review:     Component Value Date/Time   NA 139 06/09/2018 1715   K 4.0 06/09/2018 1715   CL 107 06/09/2018 1715   CO2 26 06/09/2018 1715   GLUCOSE 116 (H) 06/09/2018 1715   BUN 15 06/09/2018 1715   CREATININE 1.41 (H) 06/09/2018 1715   CALCIUM 8.6 (L) 06/09/2018 1715   PROT 7.3 06/09/2018 1715   ALBUMIN 3.5 06/09/2018 1715   AST 32 06/09/2018 1715   ALT 40 06/09/2018 1715   ALKPHOS 50 06/09/2018 1715   BILITOT 0.6 06/09/2018 1715   GFRNONAA >60 06/09/2018 1715   GFRAA >60 06/09/2018 1715       Component Value Date/Time   WBC 12.3 (H) 06/09/2018 1715   RBC 4.92 06/09/2018 1715   HGB 13.6 06/09/2018 1715   HCT 41.0 06/09/2018 1715   PLT 439 06/09/2018 1715   MCV 83.4 06/09/2018  1715   MCH 27.7 06/09/2018 1715   MCHC 33.2 06/09/2018 1715   RDW 14.4 06/09/2018 1715    No results found for: POCLITH, LITHIUM   No results found for: PHENYTOIN, PHENOBARB, VALPROATE, CBMZ   .res Assessment: Plan:    Plan:  Continue Abilify 5mg  daily  Continue Lexapro 15mg  daily. Continue Adderall XR 30mg  in the morning  Change Adderall 20mg  to 15mg  daily Continue Lunesta 3mg  at hs to target sleep.  Continue therapy  Psychological  Evaluation - rule out autism  RTC 3 months  Diagnoses and all orders for this visit:   Essam was seen today for adhd, anxiety, depression, insomnia and other.  Diagnoses and all orders for this visit:  Insomnia, unspecified type  Bipolar I disorder (HCC) -     ARIPiprazole (ABILIFY) 5 MG tablet; Take 1 tablet (5 mg total) by mouth daily. -     amphetamine-dextroamphetamine (ADDERALL) 15 MG tablet; Take 1 tablet by mouth daily before breakfast. -     amphetamine-dextroamphetamine (ADDERALL) 15 MG tablet; Take 1 tablet by mouth daily before breakfast. -     amphetamine-dextroamphetamine (ADDERALL) 15 MG tablet; Take 1 tablet by mouth daily before breakfast.  Attention deficit hyperactivity disorder (ADHD), combined type -     amphetamine-dextroamphetamine (ADDERALL XR) 30 MG 24 hr capsule; Take 1 capsule (30 mg total) by mouth daily.  Generalized anxiety disorder  Major depressive disorder, recurrent episode, moderate (HCC)  Other orders -     Eszopiclone (ESZOPICLONE) 3 MG TABS; Take 1 tablet (3 mg total) by mouth at bedtime. Take immediately before bedtime -     amphetamine-dextroamphetamine (ADDERALL XR) 30 MG 24 hr capsule; Take 1 capsule (30 mg total) by mouth daily. -     amphetamine-dextroamphetamine (ADDERALL XR) 30 MG 24 hr capsule; Take 1 capsule (30 mg total) by mouth daily. -     escitalopram (LEXAPRO) 10 MG tablet; TAKE 1 AND 1/2 TABLETS BY  MOUTH ONCE A DAY    Please see After Visit Summary for patient specific  instructions.  No future appointments.  No orders of the defined types were placed in this encounter.     -------------------------------

## 2019-11-24 ENCOUNTER — Telehealth: Payer: Self-pay | Admitting: Adult Health

## 2019-11-24 NOTE — Telephone Encounter (Addendum)
Pt called needing refill on ADDERALL. Pt wants to know if he can do two 15 MG twice a day. Please send to pharmacy on file. Pt has appt for 12/07/19.

## 2019-11-24 NOTE — Telephone Encounter (Signed)
Noted. Will discuss when he calls.

## 2019-11-26 ENCOUNTER — Ambulatory Visit (INDEPENDENT_AMBULATORY_CARE_PROVIDER_SITE_OTHER): Payer: Commercial Managed Care - PPO | Admitting: Adult Health

## 2019-11-26 ENCOUNTER — Encounter: Payer: Self-pay | Admitting: Adult Health

## 2019-11-26 DIAGNOSIS — F319 Bipolar disorder, unspecified: Secondary | ICD-10-CM

## 2019-11-26 DIAGNOSIS — G4700 Insomnia, unspecified: Secondary | ICD-10-CM

## 2019-11-26 DIAGNOSIS — F331 Major depressive disorder, recurrent, moderate: Secondary | ICD-10-CM

## 2019-11-26 DIAGNOSIS — F902 Attention-deficit hyperactivity disorder, combined type: Secondary | ICD-10-CM

## 2019-11-26 DIAGNOSIS — F411 Generalized anxiety disorder: Secondary | ICD-10-CM

## 2019-11-26 MED ORDER — ESZOPICLONE 3 MG PO TABS: 3.0000 mg | ORAL_TABLET | Freq: Every day | ORAL | 2 refills | Status: DC

## 2019-11-26 MED ORDER — AMPHETAMINE-DEXTROAMPHETAMINE 30 MG PO TABS: 30.0000 mg | ORAL_TABLET | Freq: Two times a day (BID) | ORAL | 0 refills | Status: DC

## 2019-11-26 NOTE — Progress Notes (Signed)
Norman Abbott [id number] [birth year] 34 y.o.  Virtual Visit via Telephone Note  I connected with pt on 11/26/19 at  4:40 PM EDT by telephone and verified that I am speaking with the correct person using two identifiers.   I discussed the limitations, risks, security and privacy concerns of performing an evaluation and management service by telephone and the availability of in person appointments. I also discussed with the patient that there may be a patient responsible charge related to this service. The patient expressed understanding and agreed to proceed.   I discussed the assessment and treatment plan with the patient. The patient was provided an opportunity to ask questions and all were answered. The patient agreed with the plan and demonstrated an understanding of the instructions.   The patient was advised to call back or seek an in-person evaluation if the symptoms worsen or if the condition fails to improve as anticipated.  I provided 30 minutes of non-face-to-face time during this encounter.  The patient was located at home.  The provider was located at [facility].   Norman Gell, NP   Subjective:   Patient ID:  Norman Abbott is a 34 y.o. (DOB [birth year]) male.  Chief Complaint: No chief complaint on file.   HPI   Norman Abbott presents for follow-up of depression, anxiety, insomnia, bipolar disorder, and ADHD.  Describes mood today as "ok". Pleasant. Mood symptoms - denies depression, anxiety, and irritability. Stating "things are going quite well for me". Mood are level. Denies "outbursts" - stating "I've been able to manage that". Trying to work on "indecisiveness" He and family doing well. Stable interest and motivation. Taking medications as prescribed.  Energy levels stable. Active, has a regular exercise routine. Works full time - 50 hours.  Enjoys some usual interests and activities. Married. Lives with of 3 years and 9 and 5. Brother local.  Appetite  adequate. Weight stable. Sleeps well most nights with Lunesta. Averages 6 to 7 hours. Focus and concentration stable. Work going well. Completing tasks. Managing aspects of household.  Denies SI or HI. Denies AH or VH.  Review of Systems:  Review of Systems  Musculoskeletal: Negative for gait problem.  Neurological: Negative for tremors.  Psychiatric/Behavioral:       Please refer to HPI    Medications: I have reviewed the patient's current medications.  Current Outpatient Medications  Medication Sig Dispense Refill  . amphetamine-dextroamphetamine (ADDERALL XR) 30 MG 24 hr capsule Take 1 capsule (30 mg total) by mouth daily. 30 capsule 0  . amphetamine-dextroamphetamine (ADDERALL XR) 30 MG 24 hr capsule Take 1 capsule (30 mg total) by mouth daily. 30 capsule 0  . amphetamine-dextroamphetamine (ADDERALL XR) 30 MG 24 hr capsule Take 1 capsule (30 mg total) by mouth daily. 30 capsule 0  . amphetamine-dextroamphetamine (ADDERALL) 15 MG tablet Take 1 tablet by mouth daily before breakfast. 30 tablet 0  . amphetamine-dextroamphetamine (ADDERALL) 15 MG tablet Take 1 tablet by mouth daily before breakfast. 30 tablet 0  . amphetamine-dextroamphetamine (ADDERALL) 15 MG tablet Take 1 tablet by mouth daily before breakfast. 30 tablet 0  . amphetamine-dextroamphetamine (ADDERALL) 20 MG tablet Take 1 tablet (20 mg total) by mouth daily. 30 tablet 0  . ARIPiprazole (ABILIFY) 5 MG tablet Take 1 tablet (5 mg total) by mouth daily. 90 tablet 1  . atorvastatin (LIPITOR) 10 MG tablet TAKE 1 TABLET BY MOUTH ONCE DAILY    . dicyclomine (BENTYL) 20 MG tablet Take 1 tablet (20 mg  total) by mouth 3 (three) times daily as needed (abdominal pain). 30 tablet 0  . escitalopram (LEXAPRO) 10 MG tablet TAKE 1 AND 1/2 TABLETS BY  MOUTH ONCE A DAY 135 tablet 1  . Eszopiclone (ESZOPICLONE) 3 MG TABS Take 1 tablet (3 mg total) by mouth at bedtime. Take immediately before bedtime 30 tablet 2  . LORazepam (ATIVAN) 0.5 MG  tablet Take by mouth.    . meloxicam (MOBIC) 15 MG tablet Take by mouth.     No current facility-administered medications for this visit.    Medication Side Effects: None  Allergies: No Known Allergies  Past Medical History:  Diagnosis Date  . ADHD   . Hyperlipidemia   . PTSD (post-traumatic stress disorder)     No family history on file.  Social History   Socioeconomic History  . Marital status: Single    Spouse name: Not on file  . Number of children: Not on file  . Years of education: Not on file  . Highest education level: Not on file  Occupational History  . Not on file  Tobacco Use  . Smoking status: Never Smoker  . Smokeless tobacco: Never Used  Substance and Sexual Activity  . Alcohol use: Not on file  . Drug use: Not on file  . Sexual activity: Not on file  Other Topics Concern  . Not on file  Social History Narrative  . Not on file   Social Determinants of Health   Financial Resource Strain:   . Difficulty of Paying Living Expenses:   Food Insecurity:   . Worried About Programme researcher, broadcasting/film/video in the Last Year:   . Barista in the Last Year:   Transportation Needs:   . Freight forwarder (Medical):   Marland Kitchen Lack of Transportation (Non-Medical):   Physical Activity:   . Days of Exercise per Week:   . Minutes of Exercise per Session:   Stress:   . Feeling of Stress :   Social Connections:   . Frequency of Communication with Friends and Family:   . Frequency of Social Gatherings with Friends and Family:   . Attends Religious Services:   . Active Member of Clubs or Organizations:   . Attends Banker Meetings:   Marland Kitchen Marital Status:   Intimate Partner Violence:   . Fear of Current or Ex-Partner:   . Emotionally Abused:   Marland Kitchen Physically Abused:   . Sexually Abused:     Past Medical History, Surgical history, Social history, and Family history were reviewed and updated as appropriate.   Please see review of systems for further  details on the patient's review from today.   Objective:   Physical Exam:  There were no vitals taken for this visit.  Physical Exam  Lab Review:     Component Value Date/Time   NA 139 06/09/2018 1715   K 4.0 06/09/2018 1715   CL 107 06/09/2018 1715   CO2 26 06/09/2018 1715   GLUCOSE 116 (H) 06/09/2018 1715   BUN 15 06/09/2018 1715   CREATININE 1.41 (H) 06/09/2018 1715   CALCIUM 8.6 (L) 06/09/2018 1715   PROT 7.3 06/09/2018 1715   ALBUMIN 3.5 06/09/2018 1715   AST 32 06/09/2018 1715   ALT 40 06/09/2018 1715   ALKPHOS 50 06/09/2018 1715   BILITOT 0.6 06/09/2018 1715   GFRNONAA >60 06/09/2018 1715   GFRAA >60 06/09/2018 1715       Component Value Date/Time   WBC 12.3 (  H) 06/09/2018 1715   RBC 4.92 06/09/2018 1715   HGB 13.6 06/09/2018 1715   HCT 41.0 06/09/2018 1715   PLT 439 06/09/2018 1715   MCV 83.4 06/09/2018 1715   MCH 27.7 06/09/2018 1715   MCHC 33.2 06/09/2018 1715   RDW 14.4 06/09/2018 1715    No results found for: POCLITH, LITHIUM   No results found for: PHENYTOIN, PHENOBARB, VALPROATE, CBMZ   .res Assessment: Plan:    Plan:  Continue Abilify 5mg  daily  Continue Lexapro 15mg  daily. Discontinue Adderall XR 30mg  in the morning  Start Adderall 30mg  BID Continue Lunesta 3mg  at hs to target sleep.  Continue therapy  Psychological  Evaluation - rule out autism  RTC 3 months  Diagnoses and all orders for this visit:   There are no diagnoses linked to this encounter.  Please see After Visit Summary for patient specific instructions.  Future Appointments  Date Time Provider Department Center  12/07/2019  9:20 AM Yuktha Kerchner, , NP [facility] None    No orders of the defined types were placed in this encounter.     -------------------------------

## 2019-12-07 ENCOUNTER — Other Ambulatory Visit: Payer: Self-pay | Admitting: Adult Health

## 2019-12-07 ENCOUNTER — Ambulatory Visit: Payer: Commercial Managed Care - PPO | Admitting: Adult Health

## 2019-12-07 DIAGNOSIS — F902 Attention-deficit hyperactivity disorder, combined type: Secondary | ICD-10-CM

## 2019-12-07 DIAGNOSIS — F909 Attention-deficit hyperactivity disorder, unspecified type: Secondary | ICD-10-CM

## 2019-12-07 MED ORDER — AMPHETAMINE-DEXTROAMPHETAMINE 30 MG PO TABS: 30.0000 mg | ORAL_TABLET | Freq: Two times a day (BID) | ORAL | 0 refills | Status: DC

## 2020-01-21 ENCOUNTER — Other Ambulatory Visit: Payer: Self-pay

## 2020-01-21 ENCOUNTER — Telehealth: Payer: Self-pay | Admitting: Adult Health

## 2020-01-21 DIAGNOSIS — F902 Attention-deficit hyperactivity disorder, combined type: Secondary | ICD-10-CM

## 2020-01-21 NOTE — Telephone Encounter (Signed)
Last refill 12/25/2019 pended for Rene Kocher to submit Last apt was March 2021

## 2020-01-21 NOTE — Telephone Encounter (Signed)
Pt would like a refill for [facility] sent [facility] at [facility] [place].

## 2020-01-22 MED ORDER — AMPHETAMINE-DEXTROAMPHETAMINE 30 MG PO TABS: 30.0000 mg | ORAL_TABLET | Freq: Two times a day (BID) | ORAL | 0 refills | Status: DC

## 2020-02-23 ENCOUNTER — Other Ambulatory Visit: Payer: Self-pay

## 2020-02-23 ENCOUNTER — Telehealth: Payer: Self-pay | Admitting: Adult Health

## 2020-02-23 DIAGNOSIS — F902 Attention-deficit hyperactivity disorder, combined type: Secondary | ICD-10-CM

## 2020-02-23 NOTE — Telephone Encounter (Signed)
Pt called requesting refill for Adderall 30 mg @ [facility] [place] on file.

## 2020-02-23 NOTE — Telephone Encounter (Signed)
Last refill 05/14 Pended for Rene Kocher to review and submit

## 2020-02-25 ENCOUNTER — Telehealth (INDEPENDENT_AMBULATORY_CARE_PROVIDER_SITE_OTHER): Payer: Self-pay | Admitting: Adult Health

## 2020-02-25 ENCOUNTER — Encounter: Payer: Self-pay | Admitting: Adult Health

## 2020-02-25 ENCOUNTER — Telehealth: Payer: Self-pay | Admitting: Adult Health

## 2020-02-25 DIAGNOSIS — F411 Generalized anxiety disorder: Secondary | ICD-10-CM

## 2020-02-25 DIAGNOSIS — F331 Major depressive disorder, recurrent, moderate: Secondary | ICD-10-CM

## 2020-02-25 DIAGNOSIS — F319 Bipolar disorder, unspecified: Secondary | ICD-10-CM

## 2020-02-25 DIAGNOSIS — F902 Attention-deficit hyperactivity disorder, combined type: Secondary | ICD-10-CM

## 2020-02-25 DIAGNOSIS — G4700 Insomnia, unspecified: Secondary | ICD-10-CM

## 2020-02-25 MED ORDER — AMPHETAMINE-DEXTROAMPHETAMINE 30 MG PO TABS: 30.0000 mg | ORAL_TABLET | Freq: Two times a day (BID) | ORAL | 0 refills | Status: DC

## 2020-02-25 MED ORDER — ESCITALOPRAM OXALATE 10 MG PO TABS: ORAL_TABLET | ORAL | 1 refills | Status: DC

## 2020-02-25 MED ORDER — GABAPENTIN 300 MG PO CAPS: 300.0000 mg | ORAL_CAPSULE | Freq: Every day | ORAL | 1 refills | Status: DC

## 2020-02-25 MED ORDER — ARIPIPRAZOLE 5 MG PO TABS: 5.0000 mg | ORAL_TABLET | Freq: Every day | ORAL | 1 refills | Status: DC

## 2020-02-25 NOTE — Progress Notes (Signed)
[name] [id number] Feb 27, 1986 34 y.o.  Virtual Visit via Telephone Note  I connected with pt on 02/25/20 at  4:40 PM EDT by telephone and verified that I am speaking with the correct person using two identifiers.   I discussed the limitations, risks, security and privacy concerns of performing an evaluation and management service by telephone and the availability of in person appointments. I also discussed with the patient that there may be a patient responsible charge related to this service. The patient expressed understanding and agreed to proceed.   I discussed the assessment and treatment plan with the patient. The patient was provided an opportunity to ask questions and all were answered. The patient agreed with the plan and demonstrated an understanding of the instructions.   The patient was advised to call back or seek an in-person evaluation if the symptoms worsen or if the condition fails to improve as anticipated.  I provided 30 minutes of non-face-to-face time during this encounter.  The patient was located at home.  The provider was located at [facility].   Norman Gibbs, NP   Subjective:   Patient ID:  Norman Abbott is a 34 y.o. (DOB 1986/01/06) male.  Chief Complaint: No chief complaint on file.   HPI Norman Abbott presents for follow-up of depression, anxiety, insomnia, bipolar disorder, and ADHD.  Describes mood today as "ok". Pleasant. Mood symptoms - reports some denies depression, anxiety, and irritability since running out of medication. Plans to restart Abilify today - could not afford. Stating "I'm doing alright". Mood has been up and down without the Abilify. Reports a few "outbursts". He and family doing well - recent trip to [place]. Stable interest and motivation. Taking medications as prescribed.  Energy levels stable. Active, has a regular exercise routine. Works full time - 50 hours.  Enjoys some usual interests and activities. Married.  Lives with wife of 3 years and children 9 and 5. Brother local.  Appetite adequate. Weight stable. Sleeps well most nights with Lunesta - "doesn't feel rested". Averages 6 to 7 hours. Focus and concentration stable with Adderall. Work going well. Completing tasks. Managing aspects of household.  Denies SI or HI. Denies AH or VH.  Review of Systems:  Review of Systems  Musculoskeletal: Negative for gait problem.  Neurological: Negative for tremors.  Psychiatric/Behavioral:       Please refer to HPI    Medications: I have reviewed the patient's current medications.  Current Outpatient Medications  Medication Sig Dispense Refill  . amphetamine-dextroamphetamine (ADDERALL) 30 MG tablet Take 1 tablet by mouth 2 (two) times daily. 60 tablet 0  . [START ON 03/24/2020] amphetamine-dextroamphetamine (ADDERALL) 30 MG tablet Take 1 tablet by mouth 2 (two) times daily. 60 tablet 0  . [START ON 04/21/2020] amphetamine-dextroamphetamine (ADDERALL) 30 MG tablet Take 1 tablet by mouth 2 (two) times daily. 60 tablet 0  . ARIPiprazole (ABILIFY) 5 MG tablet Take 1 tablet (5 mg total) by mouth daily. 90 tablet 1  . atorvastatin (LIPITOR) 10 MG tablet TAKE 1 TABLET BY MOUTH ONCE DAILY    . dicyclomine (BENTYL) 20 MG tablet Take 1 tablet (20 mg total) by mouth 3 (three) times daily as needed (abdominal pain). 30 tablet 0  . escitalopram (LEXAPRO) 10 MG tablet TAKE 1 AND 1/2 TABLETS BY  MOUTH ONCE A DAY 135 tablet 1  . gabapentin (NEURONTIN) 300 MG capsule Take 1 capsule (300 mg total) by mouth at bedtime. 90 capsule 1  . LORazepam (  ATIVAN) 0.5 MG tablet Take by mouth.     No current facility-administered medications for this visit.    Medication Side Effects: None  Allergies: No Known Allergies  Past Medical History:  Diagnosis Date  . ADHD   . Hyperlipidemia   . PTSD (post-traumatic stress disorder)     No family history on file.  Social History   Socioeconomic History  . Marital status:  Single    Spouse name: Not on file  . Number of children: Not on file  . Years of education: Not on file  . Highest education level: Not on file  Occupational History  . Not on file  Tobacco Use  . Smoking status: Never Smoker  . Smokeless tobacco: Never Used  Vaping Use  . Vaping Use: Never used  Substance and Sexual Activity  . Alcohol use: Not on file  . Drug use: Not on file  . Sexual activity: Not on file  Other Topics Concern  . Not on file  Social History Narrative  . Not on file   Social Determinants of Health   Financial Resource Strain:   . Difficulty of Paying Living Expenses:   Food Insecurity:   . Worried About Charity fundraiser in the Last Year:   . Arboriculturist in the Last Year:   Transportation Needs:   . Film/video editor (Medical):   Marland Kitchen Lack of Transportation (Non-Medical):   Physical Activity:   . Days of Exercise per Week:   . Minutes of Exercise per Session:   Stress:   . Feeling of Stress :   Social Connections:   . Frequency of Communication with Friends and Family:   . Frequency of Social Gatherings with Friends and Family:   . Attends Religious Services:   . Active Member of Clubs or Organizations:   . Attends Archivist Meetings:   Marland Kitchen Marital Status:   Intimate Partner Violence:   . Fear of Current or Ex-Partner:   . Emotionally Abused:   Marland Kitchen Physically Abused:   . Sexually Abused:     Past Medical History, Surgical history, Social history, and Family history were reviewed and updated as appropriate.   Please see review of systems for further details on the patient's review from today.   Objective:   Physical Exam:  There were no vitals taken for this visit.  Physical Exam Neurological:     Mental Status: He is alert and oriented to person, place, and time.     Cranial Nerves: No dysarthria.  Psychiatric:        Attention and Perception: Attention and perception normal.        Mood and Affect: Mood normal.         Speech: Speech normal.        Behavior: Behavior is cooperative.        Thought Content: Thought content normal. Thought content is not paranoid or delusional. Thought content does not include homicidal or suicidal ideation. Thought content does not include homicidal or suicidal plan.        Cognition and Memory: Cognition and memory normal.        Judgment: Judgment normal.     Comments: Insight intact     Lab Review:     Component Value Date/Time   NA 139 06/09/2018 1715   K 4.0 06/09/2018 1715   CL 107 06/09/2018 1715   CO2 26 06/09/2018 1715   GLUCOSE 116 (H) 06/09/2018 1715  BUN 15 06/09/2018 1715   CREATININE 1.41 (H) 06/09/2018 1715   CALCIUM 8.6 (L) 06/09/2018 1715   PROT 7.3 06/09/2018 1715   ALBUMIN 3.5 06/09/2018 1715   AST 32 06/09/2018 1715   ALT 40 06/09/2018 1715   ALKPHOS 50 06/09/2018 1715   BILITOT 0.6 06/09/2018 1715   GFRNONAA >60 06/09/2018 1715   GFRAA >60 06/09/2018 1715       Component Value Date/Time   WBC 12.3 (H) 06/09/2018 1715   RBC 4.92 06/09/2018 1715   HGB 13.6 06/09/2018 1715   HCT 41.0 06/09/2018 1715   PLT 439 06/09/2018 1715   MCV 83.4 06/09/2018 1715   MCH 27.7 06/09/2018 1715   MCHC 33.2 06/09/2018 1715   RDW 14.4 06/09/2018 1715    No results found for: POCLITH, LITHIUM   No results found for: PHENYTOIN, PHENOBARB, VALPROATE, CBMZ   .res Assessment: Plan:    Plan:  Restart Abilify 5mg  daily - getting today - has been out for 2 months - without insurance Lexapro 15mg  daily Adderall 30mg  BID D/C Lunesta 3mg  at hs to target sleep. Add Gabapentin 300mg  at bedtime   Continue therapy  Psychological  Evaluation - rule out autism  RTC 3 months  Discussed potential metabolic side effects associated with atypical antipsychotics, as well as potential risk for movement side effects. Advised pt to contact office if movement side effects occur.   Discussed potential benefits, risks, and side effects of stimulants with  patient to include increased heart rate, palpitations, insomnia, increased anxiety, increased irritability, or decreased appetite.  Instructed patient to contact office if experiencing any significant tolerability issues.    Diagnoses and all orders for this visit:  Insomnia, unspecified type -     gabapentin (NEURONTIN) 300 MG capsule; Take 1 capsule (300 mg total) by mouth at bedtime.  Attention deficit hyperactivity disorder (ADHD), combined type -     amphetamine-dextroamphetamine (ADDERALL) 30 MG tablet; Take 1 tablet by mouth 2 (two) times daily. -     amphetamine-dextroamphetamine (ADDERALL) 30 MG tablet; Take 1 tablet by mouth 2 (two) times daily. -     amphetamine-dextroamphetamine (ADDERALL) 30 MG tablet; Take 1 tablet by mouth 2 (two) times daily.  Bipolar I disorder (HCC) -     ARIPiprazole (ABILIFY) 5 MG tablet; Take 1 tablet (5 mg total) by mouth daily.  Generalized anxiety disorder -     escitalopram (LEXAPRO) 10 MG tablet; TAKE 1 AND 1/2 TABLETS BY  MOUTH ONCE A DAY  Major depressive disorder, recurrent episode, moderate (HCC) -     escitalopram (LEXAPRO) 10 MG tablet; TAKE 1 AND 1/2 TABLETS BY  MOUTH ONCE A DAY    Please see After Visit Summary for patient specific instructions.  No future appointments.  No orders of the defined types were placed in this encounter.     -------------------------------

## 2020-02-25 NOTE — Telephone Encounter (Signed)
Pt called has apt @ 4:40 today

## 2020-02-25 NOTE — Telephone Encounter (Signed)
Noted thank you

## 2020-02-25 NOTE — Telephone Encounter (Signed)
Pt called checking status on refill for Adderall @ [facility] [place]. Called 6/15. Waiting on Provider approval in system.

## 2020-04-20 ENCOUNTER — Telehealth: Payer: Self-pay | Admitting: Adult Health

## 2020-04-20 NOTE — Telephone Encounter (Signed)
He is taking Adderall and it is working fine. He requests to go up to 3 times per day on the regular Adderall. If not, he'd like to switch back to the Adderall XR. [facility] [place], [place]

## 2020-04-20 NOTE — Telephone Encounter (Signed)
No further increase on the IR - at 60mg  daily.

## 2020-04-21 ENCOUNTER — Other Ambulatory Visit: Payer: Self-pay | Admitting: Adult Health

## 2020-04-21 MED ORDER — AMPHETAMINE-DEXTROAMPHET ER 30 MG PO CP24
30.0000 mg | ORAL_CAPSULE | Freq: Two times a day (BID) | ORAL | 0 refills | Status: DC
Start: 2020-04-21 — End: 2020-05-31

## 2020-04-21 NOTE — Telephone Encounter (Signed)
We can try the XR 30mg  BID.

## 2020-04-21 NOTE — Telephone Encounter (Signed)
Script sent  

## 2020-04-21 NOTE — Telephone Encounter (Signed)
Patient reports he goes to work early and typically works 12 hours at least. Feels it's wearing off around 1-2 pm. Asking what you recommend ?

## 2020-04-21 NOTE — Telephone Encounter (Signed)
Okay please send to [facility] [place]. I will cancel the other Rx to be filled.

## 2020-05-27 ENCOUNTER — Telehealth: Payer: Self-pay | Admitting: Adult Health

## 2020-05-27 NOTE — Telephone Encounter (Signed)
Pt called and needs a refill of his adderall xr 30 mg  And adderall 30 mg [facility] [place].

## 2020-05-30 ENCOUNTER — Other Ambulatory Visit: Payer: Self-pay

## 2020-05-30 NOTE — Telephone Encounter (Signed)
Last refill for XR 04/22/2020, pended 1 Rx for Norman Abbott to send. Patient already has IR on file to fill.    Patient due for follow up now, please get scheduled.

## 2020-05-30 NOTE — Telephone Encounter (Signed)
Pt called back an scheduled appt for 9/21.

## 2020-05-31 ENCOUNTER — Encounter: Payer: Self-pay | Admitting: Adult Health

## 2020-05-31 ENCOUNTER — Telehealth (INDEPENDENT_AMBULATORY_CARE_PROVIDER_SITE_OTHER): Payer: BC Managed Care – PPO | Admitting: Adult Health

## 2020-05-31 DIAGNOSIS — F331 Major depressive disorder, recurrent, moderate: Secondary | ICD-10-CM

## 2020-05-31 DIAGNOSIS — F411 Generalized anxiety disorder: Secondary | ICD-10-CM

## 2020-05-31 DIAGNOSIS — G4700 Insomnia, unspecified: Secondary | ICD-10-CM

## 2020-05-31 DIAGNOSIS — F319 Bipolar disorder, unspecified: Secondary | ICD-10-CM

## 2020-05-31 DIAGNOSIS — F902 Attention-deficit hyperactivity disorder, combined type: Secondary | ICD-10-CM | POA: Diagnosis not present

## 2020-05-31 MED ORDER — ARIPIPRAZOLE 5 MG PO TABS: 5.0000 mg | ORAL_TABLET | Freq: Every day | ORAL | 3 refills | Status: DC

## 2020-05-31 MED ORDER — AMPHETAMINE-DEXTROAMPHET ER 30 MG PO CP24: 30.0000 mg | ORAL_CAPSULE | Freq: Every day | ORAL | 0 refills | Status: DC

## 2020-05-31 MED ORDER — GABAPENTIN 300 MG PO CAPS: 300.0000 mg | ORAL_CAPSULE | Freq: Every day | ORAL | 3 refills | Status: DC

## 2020-05-31 MED ORDER — AMPHETAMINE-DEXTROAMPHET ER 30 MG PO CP24: 30.0000 mg | ORAL_CAPSULE | Freq: Two times a day (BID) | ORAL | 0 refills | Status: DC

## 2020-05-31 MED ORDER — ESCITALOPRAM OXALATE 10 MG PO TABS: ORAL_TABLET | ORAL | 3 refills | Status: DC

## 2020-05-31 NOTE — Progress Notes (Signed)
Norman Abbott [id number] [birth year] 34 y.o.  Virtual Visit via Telephone Note  I connected with pt on 05/31/20 at  4:40 PM EDT by telephone and verified that I am speaking with the correct person using two identifiers.   I discussed the limitations, risks, security and privacy concerns of performing an evaluation and management service by telephone and the availability of in person appointments. I also discussed with the patient that there may be a patient responsible charge related to this service. The patient expressed understanding and agreed to proceed.   I discussed the assessment and treatment plan with the patient. The patient was provided an opportunity to ask questions and all were answered. The patient agreed with the plan and demonstrated an understanding of the instructions.   The patient was advised to call back or seek an in-person evaluation if the symptoms worsen or if the condition fails to improve as anticipated.  I provided 30 minutes of non-face-to-face time during this encounter.  The patient was located at home.  The provider was located at [facility].   Dorothyann Gibbs, NP   Subjective:   Patient ID:  Norman Abbott is a 34 y.o. (DOB [birth year]) male.  Chief Complaint: No chief complaint on file.   HPI NASHAUN HILLMER presents for follow-up of depression, anxiety, insomnia, bipolar disorder, and ADHD.  Describes mood today as "ok". Pleasant. Mood symptoms - reports decreased denies depression, anxiety, and irritability. Denies any outburst. Feels like medications continue to work well. Has been out of medications for a week. Has been "struggling" without it. Stating "I'm doing ok". Mood stabilized with restarting Abilify. He and family doing well.   Recent trip to [place] with wife. Stable interest and motivation. Taking medications as prescribed.  Energy levels stable. Active, has a regular exercise routine.  Enjoys some usual interests and  activities. Married. Lives with wife of 3 years and children 9 and 5. Brother local.  Appetite adequate. Weight stable. Sleeps well most nights with Gabapentin. Averages 6 to 7 hours. Focus and concentration stable with Adderall. Work going well. Completing tasks. Managing aspects of household. Works full time - 50 hours.  Denies SI or HI. Denies AH or VH.    Review of Systems:  Review of Systems  Musculoskeletal: Negative for gait problem.  Neurological: Negative for tremors.  Psychiatric/Behavioral:       Please refer to HPI    Medications: I have reviewed the patient's current medications.  Current Outpatient Medications  Medication Sig Dispense Refill  . amphetamine-dextroamphetamine (ADDERALL XR) 30 MG 24 hr capsule Take 1 capsule (30 mg total) by mouth 2 (two) times daily. 60 capsule 0  . amphetamine-dextroamphetamine (ADDERALL) 30 MG tablet Take 1 tablet by mouth 2 (two) times daily. 60 tablet 0  . amphetamine-dextroamphetamine (ADDERALL) 30 MG tablet Take 1 tablet by mouth 2 (two) times daily. 60 tablet 0  . amphetamine-dextroamphetamine (ADDERALL) 30 MG tablet Take 1 tablet by mouth 2 (two) times daily. 60 tablet 0  . ARIPiprazole (ABILIFY) 5 MG tablet Take 1 tablet (5 mg total) by mouth daily. 90 tablet 1  . atorvastatin (LIPITOR) 10 MG tablet TAKE 1 TABLET BY MOUTH ONCE DAILY    . dicyclomine (BENTYL) 20 MG tablet Take 1 tablet (20 mg total) by mouth 3 (three) times daily as needed (abdominal pain). 30 tablet 0  . escitalopram (LEXAPRO) 10 MG tablet TAKE 1 AND 1/2 TABLETS BY  MOUTH ONCE A DAY 135 tablet 1  .  gabapentin (NEURONTIN) 300 MG capsule Take 1 capsule (300 mg total) by mouth at bedtime. 90 capsule 1   No current facility-administered medications for this visit.    Medication Side Effects: None  Allergies: No Known Allergies  Past Medical History:  Diagnosis Date  . ADHD   . Hyperlipidemia   . PTSD (post-traumatic stress disorder)     No family history  on file.  Social History   Socioeconomic History  . Marital status: Single    Spouse name: Not on file  . Number of children: Not on file  . Years of education: Not on file  . Highest education level: Not on file  Occupational History  . Not on file  Tobacco Use  . Smoking status: Never Smoker  . Smokeless tobacco: Never Used  Vaping Use  . Vaping Use: Never used  Substance and Sexual Activity  . Alcohol use: Not on file  . Drug use: Not on file  . Sexual activity: Not on file  Other Topics Concern  . Not on file  Social History Narrative  . Not on file   Social Determinants of Health   Financial Resource Strain:   . Difficulty of Paying Living Expenses: Not on file  Food Insecurity:   . Worried About Programme researcher, broadcasting/film/video in the Last Year: Not on file  . Ran Out of Food in the Last Year: Not on file  Transportation Needs:   . Lack of Transportation (Medical): Not on file  . Lack of Transportation (Non-Medical): Not on file  Physical Activity:   . Days of Exercise per Week: Not on file  . Minutes of Exercise per Session: Not on file  Stress:   . Feeling of Stress : Not on file  Social Connections:   . Frequency of Communication with Friends and Family: Not on file  . Frequency of Social Gatherings with Friends and Family: Not on file  . Attends Religious Services: Not on file  . Active Member of Clubs or Organizations: Not on file  . Attends Banker Meetings: Not on file  . Marital Status: Not on file  Intimate Partner Violence:   . Fear of Current or Ex-Partner: Not on file  . Emotionally Abused: Not on file  . Physically Abused: Not on file  . Sexually Abused: Not on file    Past Medical History, Surgical history, Social history, and Family history were reviewed and updated as appropriate.   Please see review of systems for further details on the patient's review from today.   Objective:   Physical Exam:  There were no vitals taken for this  visit.  Physical Exam Constitutional:      General: He is not in acute distress. Musculoskeletal:        General: No deformity.  Neurological:     Mental Status: He is alert and oriented to person, place, and time.     Coordination: Coordination normal.  Psychiatric:        Attention and Perception: Attention and perception normal. He does not perceive auditory or visual hallucinations.        Mood and Affect: Mood normal. Mood is not anxious or depressed. Affect is not labile, blunt, angry or inappropriate.        Speech: Speech normal.        Behavior: Behavior normal.        Thought Content: Thought content normal. Thought content is not paranoid or delusional. Thought content does not  include homicidal or suicidal ideation. Thought content does not include homicidal or suicidal plan.        Cognition and Memory: Cognition and memory normal.        Judgment: Judgment normal.     Comments: Insight intact     Lab Review:     Component Value Date/Time   NA 139 06/09/2018 1715   K 4.0 06/09/2018 1715   CL 107 06/09/2018 1715   CO2 26 06/09/2018 1715   GLUCOSE 116 (H) 06/09/2018 1715   BUN 15 06/09/2018 1715   CREATININE 1.41 (H) 06/09/2018 1715   CALCIUM 8.6 (L) 06/09/2018 1715   PROT 7.3 06/09/2018 1715   ALBUMIN 3.5 06/09/2018 1715   AST 32 06/09/2018 1715   ALT 40 06/09/2018 1715   ALKPHOS 50 06/09/2018 1715   BILITOT 0.6 06/09/2018 1715   GFRNONAA >60 06/09/2018 1715   GFRAA >60 06/09/2018 1715       Component Value Date/Time   WBC 12.3 (H) 06/09/2018 1715   RBC 4.92 06/09/2018 1715   HGB 13.6 06/09/2018 1715   HCT 41.0 06/09/2018 1715   PLT 439 06/09/2018 1715   MCV 83.4 06/09/2018 1715   MCH 27.7 06/09/2018 1715   MCHC 33.2 06/09/2018 1715   RDW 14.4 06/09/2018 1715    No results found for: POCLITH, LITHIUM   No results found for: PHENYTOIN, PHENOBARB, VALPROATE, CBMZ   .res Assessment: Plan:    Plan:  Abilify 5mg  daily  Lexapro 15mg   daily Adderall XR 30mg  BID Gabapentin 300mg  at bedtime   Continue therapy  RTC 3 months  Discussed potential metabolic side effects associated with atypical antipsychotics, as well as potential risk for movement side effects. Advised pt to contact office if movement side effects occur.   Discussed potential benefits, risks, and side effects of stimulants with patient to include increased heart rate, palpitations, insomnia, increased anxiety, increased irritability, or decreased appetite.  Instructed patient to contact office if experiencing any significant tolerability issues.  Diagnoses and all orders for this visit:  Attention deficit hyperactivity disorder (ADHD), combined type  Insomnia, unspecified type  Major depressive disorder, recurrent episode, moderate (HCC)  Generalized anxiety disorder  Bipolar I disorder (HCC)    Please see After Visit Summary for patient specific instructions.  No future appointments.  No orders of the defined types were placed in this encounter.     -------------------------------

## 2020-06-30 ENCOUNTER — Telehealth: Payer: Self-pay | Admitting: Adult Health

## 2020-06-30 ENCOUNTER — Other Ambulatory Visit: Payer: Self-pay

## 2020-06-30 DIAGNOSIS — F902 Attention-deficit hyperactivity disorder, combined type: Secondary | ICD-10-CM

## 2020-06-30 MED ORDER — AMPHETAMINE-DEXTROAMPHET ER 30 MG PO CP24: 30.0000 mg | ORAL_CAPSULE | Freq: Two times a day (BID) | ORAL | 0 refills | Status: DC

## 2020-06-30 NOTE — Telephone Encounter (Signed)
Patient called and said that the pharmacy has RX for Adderall for 1 daily but the chart shows twice daily. Can a [id number] to show Adderall XR 2/daily. Instead of one daily?

## 2020-06-30 NOTE — Telephone Encounter (Signed)
Updated with correct quantity and instructions Pended for Rene Kocher to review and send

## 2020-08-16 ENCOUNTER — Telehealth (INDEPENDENT_AMBULATORY_CARE_PROVIDER_SITE_OTHER): Payer: BC Managed Care – PPO | Admitting: Adult Health

## 2020-08-16 ENCOUNTER — Encounter: Payer: Self-pay | Admitting: Adult Health

## 2020-08-16 DIAGNOSIS — F411 Generalized anxiety disorder: Secondary | ICD-10-CM

## 2020-08-16 DIAGNOSIS — F331 Major depressive disorder, recurrent, moderate: Secondary | ICD-10-CM

## 2020-08-16 DIAGNOSIS — F902 Attention-deficit hyperactivity disorder, combined type: Secondary | ICD-10-CM

## 2020-08-16 DIAGNOSIS — F319 Bipolar disorder, unspecified: Secondary | ICD-10-CM

## 2020-08-16 DIAGNOSIS — F4310 Post-traumatic stress disorder, unspecified: Secondary | ICD-10-CM

## 2020-08-16 DIAGNOSIS — G4700 Insomnia, unspecified: Secondary | ICD-10-CM

## 2020-08-16 MED ORDER — AMPHETAMINE-DEXTROAMPHETAMINE 30 MG PO TABS: 30.0000 mg | ORAL_TABLET | Freq: Every day | ORAL | 0 refills | Status: DC

## 2020-08-16 MED ORDER — AMPHETAMINE-DEXTROAMPHETAMINE 30 MG PO TABS: 30.0000 mg | ORAL_TABLET | Freq: Two times a day (BID) | ORAL | 0 refills | Status: DC

## 2020-08-16 NOTE — Progress Notes (Signed)
Norman Abbott [id number] 01-Mar-1986 34 y.o.  Subjective:   Patient ID:  Norman Abbott is a 34 y.o. (DOB 07-18-1986) male.  Chief Complaint: No chief complaint on file.   HPI Norman Abbott presents to the office today for follow-up of depression, anxiety, insomnia, bipolar disorder, and ADHD.  Describes mood today as "ok". Pleasant. Mood symptoms - reports decreased denies depression, anxiety, and irritability. Denies outburst. Reports some "adjustment" issues with working a second job. Does not feel like the Adderall XR is working with current schedule and would like to switch back to the IR formula. He and family doing well.Looking forward to the holidays. Has a cruise scheduled in January. Stable interest and motivation. Taking medications as prescribed.  Energy levels stable. Active, does not have a regular exercise routine. Issues with shoulder - "excruciating pain". Enjoys some usual interests and activities. Married. Lives with wife of 3 years and children 9 and 5. Brother local.  Appetite adequate. Weight stable. Sleeps well most nights with Gabapentin. Averages 6 to 7 hours. Focus and concentration stable with Adderall. Work going well. Completing tasks. Managing aspects of household. Works full time.  Denies SI or HI. Denies AH or VH.    Review of Systems:  Review of Systems  Musculoskeletal: Negative for gait problem.  Neurological: Negative for tremors.  Psychiatric/Behavioral:       Please refer to HPI    Medications: I have reviewed the patient's current medications.  Current Outpatient Medications  Medication Sig Dispense Refill  . amphetamine-dextroamphetamine (ADDERALL) 30 MG tablet Take 1 tablet by mouth 2 (two) times daily. 60 tablet 0  . [START ON 09/13/2020] amphetamine-dextroamphetamine (ADDERALL) 30 MG tablet Take 1 tablet by mouth daily. 60 tablet 0  . [START ON 10/11/2020] amphetamine-dextroamphetamine (ADDERALL) 30 MG tablet Take 1 tablet by mouth daily. 60  tablet 0  . ARIPiprazole (ABILIFY) 5 MG tablet Take 1 tablet (5 mg total) by mouth daily. 90 tablet 3  . atorvastatin (LIPITOR) 10 MG tablet TAKE 1 TABLET BY MOUTH ONCE DAILY    . dicyclomine (BENTYL) 20 MG tablet Take 1 tablet (20 mg total) by mouth 3 (three) times daily as needed (abdominal pain). 30 tablet 0  . escitalopram (LEXAPRO) 10 MG tablet TAKE 1 AND 1/2 TABLETS BY  MOUTH ONCE A DAY 135 tablet 3  . gabapentin (NEURONTIN) 300 MG capsule Take 1 capsule (300 mg total) by mouth at bedtime. 90 capsule 3   No current facility-administered medications for this visit.    Medication Side Effects: None  Allergies: No Known Allergies  Past Medical History:  Diagnosis Date  . ADHD   . Hyperlipidemia   . PTSD (post-traumatic stress disorder)     No family history on file.  Social History   Socioeconomic History  . Marital status: Single    Spouse name: Not on file  . Number of children: Not on file  . Years of education: Not on file  . Highest education level: Not on file  Occupational History  . Not on file  Tobacco Use  . Smoking status: Never Smoker  . Smokeless tobacco: Never Used  Vaping Use  . Vaping Use: Never used  Substance and Sexual Activity  . Alcohol use: Not on file  . Drug use: Not on file  . Sexual activity: Not on file  Other Topics Concern  . Not on file  Social History Narrative  . Not on file   Social Determinants of Health   Financial  Resource Strain:   . Difficulty of Paying Living Expenses: Not on file  Food Insecurity:   . Worried About Programme researcher, broadcasting/film/video in the Last Year: Not on file  . Ran Out of Food in the Last Year: Not on file  Transportation Needs:   . Lack of Transportation (Medical): Not on file  . Lack of Transportation (Non-Medical): Not on file  Physical Activity:   . Days of Exercise per Week: Not on file  . Minutes of Exercise per Session: Not on file  Stress:   . Feeling of Stress : Not on file  Social Connections:    . Frequency of Communication with Friends and Family: Not on file  . Frequency of Social Gatherings with Friends and Family: Not on file  . Attends Religious Services: Not on file  . Active Member of Clubs or Organizations: Not on file  . Attends Banker Meetings: Not on file  . Marital Status: Not on file  Intimate Partner Violence:   . Fear of Current or Ex-Partner: Not on file  . Emotionally Abused: Not on file  . Physically Abused: Not on file  . Sexually Abused: Not on file    Past Medical History, Surgical history, Social history, and Family history were reviewed and updated as appropriate.   Please see review of systems for further details on the patient's review from today.   Objective:   Physical Exam:  There were no vitals taken for this visit.  Physical Exam Neurological:     Mental Status: He is alert and oriented to person, place, and time.     Cranial Nerves: No dysarthria.  Psychiatric:        Attention and Perception: Attention and perception normal.        Mood and Affect: Mood normal.        Speech: Speech normal.        Behavior: Behavior is cooperative.        Thought Content: Thought content normal. Thought content is not paranoid or delusional. Thought content does not include homicidal or suicidal ideation. Thought content does not include homicidal or suicidal plan.        Cognition and Memory: Cognition and memory normal.        Judgment: Judgment normal.     Comments: Insight intact     Lab Review:     Component Value Date/Time   NA 139 06/09/2018 1715   K 4.0 06/09/2018 1715   CL 107 06/09/2018 1715   CO2 26 06/09/2018 1715   GLUCOSE 116 (H) 06/09/2018 1715   BUN 15 06/09/2018 1715   CREATININE 1.41 (H) 06/09/2018 1715   CALCIUM 8.6 (L) 06/09/2018 1715   PROT 7.3 06/09/2018 1715   ALBUMIN 3.5 06/09/2018 1715   AST 32 06/09/2018 1715   ALT 40 06/09/2018 1715   ALKPHOS 50 06/09/2018 1715   BILITOT 0.6 06/09/2018 1715    GFRNONAA >60 06/09/2018 1715   GFRAA >60 06/09/2018 1715       Component Value Date/Time   WBC 12.3 (H) 06/09/2018 1715   RBC 4.92 06/09/2018 1715   HGB 13.6 06/09/2018 1715   HCT 41.0 06/09/2018 1715   PLT 439 06/09/2018 1715   MCV 83.4 06/09/2018 1715   MCH 27.7 06/09/2018 1715   MCHC 33.2 06/09/2018 1715   RDW 14.4 06/09/2018 1715    No results found for: POCLITH, LITHIUM   No results found for: PHENYTOIN, PHENOBARB, VALPROATE, CBMZ   .res  Assessment: Plan:    Plan:  Abilify 5mg  daily  Lexapro 15mg  daily D/C Adderall XR 30mg  BID Add Adderall 30mg  BID Gabapentin 300mg  at bedtime   Continue therapy  RTC 3 months  Discussed potential metabolic side effects associated with atypical antipsychotics, as well as potential risk for movement side effects. Advised pt to contact office if movement side effects occur.   Discussed potential benefits, risks, and side effects of stimulants with patient to include increased heart rate, palpitations, insomnia, increased anxiety, increased irritability, or decreased appetite.  Instructed patient to contact office if experiencing any significant tolerability issues.    Diagnoses and all orders for this visit:  Attention deficit hyperactivity disorder (ADHD), combined type -     amphetamine-dextroamphetamine (ADDERALL) 30 MG tablet; Take 1 tablet by mouth 2 (two) times daily. -     amphetamine-dextroamphetamine (ADDERALL) 30 MG tablet; Take 1 tablet by mouth daily. -     amphetamine-dextroamphetamine (ADDERALL) 30 MG tablet; Take 1 tablet by mouth daily.  Insomnia, unspecified type  Major depressive disorder, recurrent episode, moderate (HCC)  Generalized anxiety disorder  Bipolar I disorder (HCC)  PTSD (post-traumatic stress disorder)     Please see After Visit Summary for patient specific instructions.  No future appointments.  No orders of the defined types were placed in this  encounter.   -------------------------------

## 2020-11-14 ENCOUNTER — Telehealth: Payer: Self-pay | Admitting: Adult Health

## 2020-11-14 ENCOUNTER — Other Ambulatory Visit: Payer: Self-pay | Admitting: Adult Health

## 2020-11-14 DIAGNOSIS — F902 Attention-deficit hyperactivity disorder, combined type: Secondary | ICD-10-CM

## 2020-11-14 MED ORDER — AMPHETAMINE-DEXTROAMPHETAMINE 30 MG PO TABS: 30.0000 mg | ORAL_TABLET | Freq: Two times a day (BID) | ORAL | 0 refills | Status: DC

## 2020-11-14 NOTE — Telephone Encounter (Signed)
Norman Abbott called to request refill of his Adderall 30mg .  Though he says it should be 1 pill twice a day.  Last prescription shows 1/day, but again he said it should twice a day.  Has appt tomorrow 11/15/20.  Send to new pharmacy [facility] at [address] [place], [place].  [facility]

## 2020-11-14 NOTE — Telephone Encounter (Signed)
Script sent  

## 2020-11-15 ENCOUNTER — Ambulatory Visit: Payer: Self-pay | Admitting: Adult Health

## 2020-12-19 ENCOUNTER — Telehealth: Payer: Self-pay | Admitting: Adult Health

## 2020-12-19 ENCOUNTER — Other Ambulatory Visit: Payer: Self-pay | Admitting: Adult Health

## 2020-12-19 DIAGNOSIS — F902 Attention-deficit hyperactivity disorder, combined type: Secondary | ICD-10-CM

## 2020-12-19 MED ORDER — AMPHETAMINE-DEXTROAMPHETAMINE 30 MG PO TABS: 30.0000 mg | ORAL_TABLET | Freq: Every day | ORAL | 0 refills | Status: DC

## 2020-12-19 NOTE — Telephone Encounter (Signed)
Please review

## 2020-12-19 NOTE — Telephone Encounter (Signed)
Pt would like a refill on Adderall 30mg . [facility] [place]. Pt has appt 12/27/19.

## 2020-12-19 NOTE — Telephone Encounter (Signed)
Script sent  

## 2020-12-21 ENCOUNTER — Other Ambulatory Visit: Payer: Self-pay

## 2020-12-21 ENCOUNTER — Telehealth: Payer: Self-pay | Admitting: Adult Health

## 2020-12-21 DIAGNOSIS — F902 Attention-deficit hyperactivity disorder, combined type: Secondary | ICD-10-CM

## 2020-12-21 MED ORDER — AMPHETAMINE-DEXTROAMPHETAMINE 30 MG PO TABS: 30.0000 mg | ORAL_TABLET | Freq: Two times a day (BID) | ORAL | 0 refills | Status: DC

## 2020-12-21 NOTE — Telephone Encounter (Signed)
Pended updated bid

## 2020-12-21 NOTE — Telephone Encounter (Signed)
Patient lm stating his Adderall directions were submitted incorrectly to the pharmacy. He stated Rene Kocher changed his dosage to twice a day, but his refill doesn't reflect that. It's stating once per day.  Please submit a corrected refill before the holiday per his request.[id number] [place] - [address]  [address], [place] [address]  Phone:  [phone] Fax:  [phone]  Direct calls to # [phone]

## 2020-12-26 ENCOUNTER — Ambulatory Visit: Payer: Self-pay | Admitting: Adult Health

## 2020-12-27 ENCOUNTER — Telehealth (INDEPENDENT_AMBULATORY_CARE_PROVIDER_SITE_OTHER): Payer: BC Managed Care – PPO | Admitting: Adult Health

## 2020-12-27 ENCOUNTER — Encounter: Payer: Self-pay | Admitting: Adult Health

## 2020-12-27 DIAGNOSIS — F331 Major depressive disorder, recurrent, moderate: Secondary | ICD-10-CM | POA: Diagnosis not present

## 2020-12-27 DIAGNOSIS — F319 Bipolar disorder, unspecified: Secondary | ICD-10-CM

## 2020-12-27 DIAGNOSIS — M25569 Pain in unspecified knee: Secondary | ICD-10-CM | POA: Insufficient documentation

## 2020-12-27 DIAGNOSIS — F411 Generalized anxiety disorder: Secondary | ICD-10-CM

## 2020-12-27 DIAGNOSIS — G4700 Insomnia, unspecified: Secondary | ICD-10-CM

## 2020-12-27 DIAGNOSIS — F902 Attention-deficit hyperactivity disorder, combined type: Secondary | ICD-10-CM

## 2020-12-27 MED ORDER — AMPHETAMINE-DEXTROAMPHETAMINE 30 MG PO TABS: 30.0000 mg | ORAL_TABLET | Freq: Two times a day (BID) | ORAL | 0 refills | Status: DC

## 2020-12-27 MED ORDER — AMPHETAMINE-DEXTROAMPHETAMINE 30 MG PO TABS: 30.0000 mg | ORAL_TABLET | Freq: Every day | ORAL | 0 refills | Status: DC

## 2020-12-27 NOTE — Progress Notes (Signed)
Norman Abbott [id number] 08-17-1986 35 y.o.  Virtual Visit via Telephone Note  I connected with pt on 12/27/20 at  1:00 PM EDT by telephone and verified that I am speaking with the correct person using two identifiers.   I discussed the limitations, risks, security and privacy concerns of performing an evaluation and management service by telephone and the availability of in person appointments. I also discussed with the patient that there may be a patient responsible charge related to this service. The patient expressed understanding and agreed to proceed.   I discussed the assessment and treatment plan with the patient. The patient was provided an opportunity to ask questions and all were answered. The patient agreed with the plan and demonstrated an understanding of the instructions.   The patient was advised to call back or seek an in-person evaluation if the symptoms worsen or if the condition fails to improve as anticipated.  I provided 30 minutes of non-face-to-face time during this encounter.  The patient was located at home.  The provider was located at [facility].   Dorothyann Gibbs, NP   Subjective:   Patient ID:  Norman Abbott is a 35 y.o. (DOB 04/17/1986) male.  Chief Complaint: No chief complaint on file.   HPI Norman Abbott presents for follow-up of depression, anxiety, insomnia, bipolar disorder, and ADHD.  Describes mood today as "ok". Pleasant. Mood symptoms - reports denies depression, anxiety, and irritability. Denies any outburst. Stating "I'm doing pretty good". Feels like current medications are working well. Recent shoulder surgery - recovering well. Plans to return to work soon. He and family doing well. Stable interest and motivation. Taking medications as prescribed.  Energy levels stable. Active, does not have a regular exercise routine with current physical disabilities.  Enjoys some usual interests and activities. Married. Lives with wife of 3  years and children 9 and 5. Brother local.  Appetite adequate. Weight gain 10 pounds with inactivity. Sleeps well most nights with Gabapentin. Averages 7 to 8 hours. Focus and concentration stable with Adderall - IR formula working better. Completing tasks. Managing aspects of household. Works full time - out on leave.  Denies SI or HI.  Denies AH or VH.   Review of Systems:  Review of Systems  Musculoskeletal: Negative for gait problem.  Neurological: Negative for tremors.  Psychiatric/Behavioral:       Please refer to HPI    Medications: I have reviewed the patient's current medications.  Current Outpatient Medications  Medication Sig Dispense Refill  . amphetamine-dextroamphetamine (ADDERALL) 30 MG tablet Take 1 tablet by mouth daily. 60 tablet 0  . [START ON 01/24/2021] amphetamine-dextroamphetamine (ADDERALL) 30 MG tablet Take 1 tablet by mouth 2 (two) times daily. 60 tablet 0  . [START ON 02/21/2021] amphetamine-dextroamphetamine (ADDERALL) 30 MG tablet Take 1 tablet by mouth 2 (two) times daily. 60 tablet 0  . ARIPiprazole (ABILIFY) 5 MG tablet Take 1 tablet (5 mg total) by mouth daily. 90 tablet 3  . atorvastatin (LIPITOR) 10 MG tablet TAKE 1 TABLET BY MOUTH ONCE DAILY    . dicyclomine (BENTYL) 20 MG tablet Take 1 tablet (20 mg total) by mouth 3 (three) times daily as needed (abdominal pain). 30 tablet 0  . escitalopram (LEXAPRO) 10 MG tablet TAKE 1 AND 1/2 TABLETS BY  MOUTH ONCE A DAY 135 tablet 3  . gabapentin (NEURONTIN) 300 MG capsule Take 1 capsule (300 mg total) by mouth at bedtime. 90 capsule 3   No current facility-administered  medications for this visit.    Medication Side Effects: None  Allergies: No Known Allergies  Past Medical History:  Diagnosis Date  . ADHD   . Hyperlipidemia   . PTSD (post-traumatic stress disorder)     No family history on file.  Social History   Socioeconomic History  . Marital status: Single    Spouse name: Not on file  .  Number of children: Not on file  . Years of education: Not on file  . Highest education level: Not on file  Occupational History  . Not on file  Tobacco Use  . Smoking status: Never Smoker  . Smokeless tobacco: Never Used  Vaping Use  . Vaping Use: Never used  Substance and Sexual Activity  . Alcohol use: Not on file  . Drug use: Not on file  . Sexual activity: Not on file  Other Topics Concern  . Not on file  Social History Narrative  . Not on file   Social Determinants of Health   Financial Resource Strain: Not on file  Food Insecurity: Not on file  Transportation Needs: Not on file  Physical Activity: Not on file  Stress: Not on file  Social Connections: Not on file  Intimate Partner Violence: Not on file    Past Medical History, Surgical history, Social history, and Family history were reviewed and updated as appropriate.   Please see review of systems for further details on the patient's review from today.   Objective:   Physical Exam:  There were no vitals taken for this visit.  Physical Exam Constitutional:      General: He is not in acute distress. Musculoskeletal:        General: No deformity.  Neurological:     Mental Status: He is alert and oriented to person, place, and time.     Coordination: Coordination normal.  Psychiatric:        Attention and Perception: Attention and perception normal. He does not perceive auditory or visual hallucinations.        Mood and Affect: Mood normal. Mood is not anxious or depressed. Affect is not labile, blunt, angry or inappropriate.        Speech: Speech normal.        Behavior: Behavior normal.        Thought Content: Thought content normal. Thought content is not paranoid or delusional. Thought content does not include homicidal or suicidal ideation. Thought content does not include homicidal or suicidal plan.        Cognition and Memory: Cognition and memory normal.        Judgment: Judgment normal.      Comments: Insight intact     Lab Review:     Component Value Date/Time   NA 139 06/09/2018 1715   K 4.0 06/09/2018 1715   CL 107 06/09/2018 1715   CO2 26 06/09/2018 1715   GLUCOSE 116 (H) 06/09/2018 1715   BUN 15 06/09/2018 1715   CREATININE 1.41 (H) 06/09/2018 1715   CALCIUM 8.6 (L) 06/09/2018 1715   PROT 7.3 06/09/2018 1715   ALBUMIN 3.5 06/09/2018 1715   AST 32 06/09/2018 1715   ALT 40 06/09/2018 1715   ALKPHOS 50 06/09/2018 1715   BILITOT 0.6 06/09/2018 1715   GFRNONAA >60 06/09/2018 1715   GFRAA >60 06/09/2018 1715       Component Value Date/Time   WBC 12.3 (H) 06/09/2018 1715   RBC 4.92 06/09/2018 1715   HGB 13.6 06/09/2018 1715  HCT 41.0 06/09/2018 1715   PLT 439 06/09/2018 1715   MCV 83.4 06/09/2018 1715   MCH 27.7 06/09/2018 1715   MCHC 33.2 06/09/2018 1715   RDW 14.4 06/09/2018 1715    No results found for: POCLITH, LITHIUM   No results found for: PHENYTOIN, PHENOBARB, VALPROATE, CBMZ   .res Assessment: Plan:    Plan:  Abilify 5mg  daily  Lexapro 15mg  daily Gabapentin 300mg  at bedtime   Adderall 30mg  BID  Continue therapy  RTC 3 months  Discussed potential metabolic side effects associated with atypical antipsychotics, as well as potential risk for movement side effects. Advised pt to contact office if movement side effects occur.   Discussed potential benefits, risks, and side effects of stimulants with patient to include increased heart rate, palpitations, insomnia, increased anxiety, increased irritability, or decreased appetite.  Instructed patient to contact office if experiencing any significant tolerability issues.  Diagnoses and all orders for this visit:  Attention deficit hyperactivity disorder (ADHD), combined type -     amphetamine-dextroamphetamine (ADDERALL) 30 MG tablet; Take 1 tablet by mouth daily. -     amphetamine-dextroamphetamine (ADDERALL) 30 MG tablet; Take 1 tablet by mouth 2 (two) times daily. -      amphetamine-dextroamphetamine (ADDERALL) 30 MG tablet; Take 1 tablet by mouth 2 (two) times daily.  Insomnia, unspecified type  Major depressive disorder, recurrent episode, moderate (HCC)  Generalized anxiety disorder  Bipolar I disorder (HCC)    Please see After Visit Summary for patient specific instructions.  No future appointments.  No orders of the defined types were placed in this encounter.     -------------------------------

## 2021-03-23 ENCOUNTER — Telehealth: Payer: Self-pay | Admitting: Adult Health

## 2021-03-23 ENCOUNTER — Other Ambulatory Visit: Payer: Self-pay

## 2021-03-23 DIAGNOSIS — F902 Attention-deficit hyperactivity disorder, combined type: Secondary | ICD-10-CM

## 2021-03-23 MED ORDER — AMPHETAMINE-DEXTROAMPHETAMINE 30 MG PO TABS: 30.0000 mg | ORAL_TABLET | Freq: Every day | ORAL | 0 refills | Status: DC

## 2021-03-23 NOTE — Telephone Encounter (Signed)
Patient called stating the request from this morning for Adderall 30 mg 2 X a day was sent in incorrectly. Please contact patient when he can pick up the correct administration of the Rx. # [phone].

## 2021-03-23 NOTE — Telephone Encounter (Signed)
Pended.

## 2021-03-23 NOTE — Telephone Encounter (Signed)
Pt called and made an appt for Monday 7/18. He needs a refill on his adderall 30 mg 2 x daily [facility] [place] [facility] [address]

## 2021-03-24 MED ORDER — AMPHETAMINE-DEXTROAMPHETAMINE 30 MG PO TABS: 30.0000 mg | ORAL_TABLET | Freq: Two times a day (BID) | ORAL | 0 refills | Status: DC

## 2021-03-27 ENCOUNTER — Telehealth (INDEPENDENT_AMBULATORY_CARE_PROVIDER_SITE_OTHER): Payer: BC Managed Care – PPO | Admitting: Adult Health

## 2021-03-27 ENCOUNTER — Other Ambulatory Visit: Payer: Self-pay

## 2021-03-27 ENCOUNTER — Encounter: Payer: Self-pay | Admitting: Adult Health

## 2021-03-27 DIAGNOSIS — F411 Generalized anxiety disorder: Secondary | ICD-10-CM | POA: Diagnosis not present

## 2021-03-27 DIAGNOSIS — G4700 Insomnia, unspecified: Secondary | ICD-10-CM

## 2021-03-27 DIAGNOSIS — F319 Bipolar disorder, unspecified: Secondary | ICD-10-CM | POA: Diagnosis not present

## 2021-03-27 DIAGNOSIS — F331 Major depressive disorder, recurrent, moderate: Secondary | ICD-10-CM

## 2021-03-27 DIAGNOSIS — F902 Attention-deficit hyperactivity disorder, combined type: Secondary | ICD-10-CM

## 2021-03-27 MED ORDER — AMPHETAMINE-DEXTROAMPHETAMINE 30 MG PO TABS: 30.0000 mg | ORAL_TABLET | Freq: Every day | ORAL | 0 refills | Status: DC

## 2021-03-27 MED ORDER — ARIPIPRAZOLE 5 MG PO TABS: 5.0000 mg | ORAL_TABLET | Freq: Every day | ORAL | 3 refills | Status: DC

## 2021-03-27 MED ORDER — AMPHETAMINE-DEXTROAMPHETAMINE 30 MG PO TABS: 30.0000 mg | ORAL_TABLET | Freq: Two times a day (BID) | ORAL | 0 refills | Status: DC

## 2021-03-27 MED ORDER — GABAPENTIN 300 MG PO CAPS: 300.0000 mg | ORAL_CAPSULE | Freq: Every day | ORAL | 3 refills | Status: DC

## 2021-03-27 MED ORDER — ESCITALOPRAM OXALATE 10 MG PO TABS: ORAL_TABLET | ORAL | 3 refills | Status: DC

## 2021-03-27 NOTE — Progress Notes (Signed)
Norman Abbott [id number] 02-24-1986 35 y.o.  Subjective:   Patient ID:  Norman Abbott is a 35 y.o. (DOB 1985/10/12) male.  Chief Complaint: No chief complaint on file.   HPI Norman Abbott presents to the office today for follow-up of depression, anxiety, insomnia, bipolar disorder, and ADHD.  Describes mood today as "ok". Pleasant. Mood symptoms - denies depression, anxiety, and irritability. Denies any outbursts. Stating "I'm doing alright". Feels like current medications are working well. Has gotten an emotional support dog and feel that helps keep him "calm". He and family doing well. Stable interest and motivation. Taking medications as prescribed.  Energy levels stable. Active, has have a regular exercise routine. Enjoys some usual interests and activities. Married. Lives with wife 2 children. Brother local.  Appetite adequate. Weight stable - 189 pounds. Sleeps well most nights with Gabapentin. Averages 7 to 8 hours. Focus and concentration stable with Adderall. Completing tasks. Managing aspects of household. Works full time - [employer]. Denies SI or HI.  Denies AH or VH.  Review of Systems:  Review of Systems  Musculoskeletal:  Negative for gait problem.  Neurological:  Negative for tremors.  Psychiatric/Behavioral:         Please refer to HPI   Medications: I have reviewed the patient's current medications.  Current Outpatient Medications  Medication Sig Dispense Refill   amphetamine-dextroamphetamine (ADDERALL) 30 MG tablet Take 1 tablet by mouth 2 (two) times daily. 60 tablet 0   [START ON 04/24/2021] amphetamine-dextroamphetamine (ADDERALL) 30 MG tablet Take 1 tablet by mouth 2 (two) times daily. 60 tablet 0   [START ON 05/22/2021] amphetamine-dextroamphetamine (ADDERALL) 30 MG tablet Take 1 tablet by mouth daily. 60 tablet 0   ARIPiprazole (ABILIFY) 5 MG tablet Take 1 tablet (5 mg total) by mouth daily. 90 tablet 3   atorvastatin (LIPITOR) 10 MG tablet TAKE 1 TABLET BY MOUTH  ONCE DAILY     dicyclomine (BENTYL) 20 MG tablet Take 1 tablet (20 mg total) by mouth 3 (three) times daily as needed (abdominal pain). 30 tablet 0   escitalopram (LEXAPRO) 10 MG tablet TAKE 1 AND 1/2 TABLETS BY  MOUTH ONCE A DAY 135 tablet 3   gabapentin (NEURONTIN) 300 MG capsule Take 1 capsule (300 mg total) by mouth at bedtime. 90 capsule 3   No current facility-administered medications for this visit.    Medication Side Effects: None  Allergies: No Known Allergies  Past Medical History:  Diagnosis Date   ADHD    Hyperlipidemia    PTSD (post-traumatic stress disorder)     Past Medical History, Surgical history, Social history, and Family history were reviewed and updated as appropriate.   Please see review of systems for further details on the patient's review from today.   Objective:   Physical Exam:  There were no vitals taken for this visit.  Physical Exam Constitutional:      General: He is not in acute distress. Musculoskeletal:        General: No deformity.  Neurological:     Mental Status: He is alert and oriented to person, place, and time.     Coordination: Coordination normal.  Psychiatric:        Attention and Perception: Attention and perception normal. He does not perceive auditory or visual hallucinations.        Mood and Affect: Mood normal. Mood is not anxious or depressed. Affect is not labile, blunt, angry or inappropriate.        Speech: Speech  normal.        Behavior: Behavior normal.        Thought Content: Thought content normal. Thought content is not paranoid or delusional. Thought content does not include homicidal or suicidal ideation. Thought content does not include homicidal or suicidal plan.        Cognition and Memory: Cognition and memory normal.        Judgment: Judgment normal.     Comments: Insight intact    Lab Review:     Component Value Date/Time   NA 139 06/09/2018 1715   K 4.0 06/09/2018 1715   CL 107 06/09/2018 1715    CO2 26 06/09/2018 1715   GLUCOSE 116 (H) 06/09/2018 1715   BUN 15 06/09/2018 1715   CREATININE 1.41 (H) 06/09/2018 1715   CALCIUM 8.6 (L) 06/09/2018 1715   PROT 7.3 06/09/2018 1715   ALBUMIN 3.5 06/09/2018 1715   AST 32 06/09/2018 1715   ALT 40 06/09/2018 1715   ALKPHOS 50 06/09/2018 1715   BILITOT 0.6 06/09/2018 1715   GFRNONAA >60 06/09/2018 1715   GFRAA >60 06/09/2018 1715       Component Value Date/Time   WBC 12.3 (H) 06/09/2018 1715   RBC 4.92 06/09/2018 1715   HGB 13.6 06/09/2018 1715   HCT 41.0 06/09/2018 1715   PLT 439 06/09/2018 1715   MCV 83.4 06/09/2018 1715   MCH 27.7 06/09/2018 1715   MCHC 33.2 06/09/2018 1715   RDW 14.4 06/09/2018 1715    No results found for: POCLITH, LITHIUM   No results found for: PHENYTOIN, PHENOBARB, VALPROATE, CBMZ   .res Assessment: Plan:     Plan:  Abilify 5mg  daily  Lexapro 15mg  daily Gabapentin 300mg  at bedtime   Adderall 30mg  BID  Continue therapy  RTC 3 months  Discussed potential metabolic side effects associated with atypical antipsychotics, as well as potential risk for movement side effects. Advised pt to contact office if movement side effects occur.   Discussed potential benefits, risks, and side effects of stimulants with patient to include increased heart rate, palpitations, insomnia, increased anxiety, increased irritability, or decreased appetite.  Instructed patient to contact office if experiencing any significant tolerability issues.  Diagnoses and all orders for this visit:  Bipolar I disorder (HCC) -     ARIPiprazole (ABILIFY) 5 MG tablet; Take 1 tablet (5 mg total) by mouth daily.  Insomnia, unspecified type -     gabapentin (NEURONTIN) 300 MG capsule; Take 1 capsule (300 mg total) by mouth at bedtime.  Major depressive disorder, recurrent episode, moderate (HCC) -     escitalopram (LEXAPRO) 10 MG tablet; TAKE 1 AND 1/2 TABLETS BY  MOUTH ONCE A DAY  Generalized anxiety disorder -     escitalopram  (LEXAPRO) 10 MG tablet; TAKE 1 AND 1/2 TABLETS BY  MOUTH ONCE A DAY  Attention deficit hyperactivity disorder (ADHD), combined type -     amphetamine-dextroamphetamine (ADDERALL) 30 MG tablet; Take 1 tablet by mouth 2 (two) times daily. -     amphetamine-dextroamphetamine (ADDERALL) 30 MG tablet; Take 1 tablet by mouth 2 (two) times daily. -     amphetamine-dextroamphetamine (ADDERALL) 30 MG tablet; Take 1 tablet by mouth daily.    Please see After Visit Summary for patient specific instructions.  Future Appointments  Date Time Provider Department Center  06/27/2021  8:00 AM Aaralyn Kil, , NP [facility] None    No orders of the defined types were placed in this encounter.   -------------------------------

## 2021-05-22 ENCOUNTER — Other Ambulatory Visit: Payer: Self-pay

## 2021-05-22 ENCOUNTER — Telehealth: Payer: Self-pay | Admitting: Adult Health

## 2021-05-22 MED ORDER — AMPHETAMINE-DEXTROAMPHETAMINE 20 MG PO TABS: 20.0000 mg | ORAL_TABLET | Freq: Two times a day (BID) | ORAL | 0 refills | Status: DC

## 2021-05-22 NOTE — Telephone Encounter (Signed)
Yes

## 2021-05-22 NOTE — Telephone Encounter (Signed)
Norman Abbott called to refill of his Adderall but the pharmacy only has 20mg .  So he needs a prescription sent in for 20mg  2/daily.  This will be a lower dose, but it is all they have.  [facility] [place]

## 2021-05-22 NOTE — Telephone Encounter (Signed)
Script sent  

## 2021-05-22 NOTE — Telephone Encounter (Signed)
pended

## 2021-05-22 NOTE — Telephone Encounter (Signed)
Lower dose ok?

## 2021-06-23 ENCOUNTER — Other Ambulatory Visit: Payer: Self-pay

## 2021-06-23 ENCOUNTER — Telehealth: Payer: Self-pay | Admitting: Adult Health

## 2021-06-23 DIAGNOSIS — F902 Attention-deficit hyperactivity disorder, combined type: Secondary | ICD-10-CM

## 2021-06-23 MED ORDER — AMPHETAMINE-DEXTROAMPHETAMINE 30 MG PO TABS: 30.0000 mg | ORAL_TABLET | Freq: Two times a day (BID) | ORAL | 0 refills | Status: DC

## 2021-06-23 NOTE — Telephone Encounter (Signed)
Pended.

## 2021-06-23 NOTE — Telephone Encounter (Signed)
Pt called and would like Adderall 30mg  script refilled to  [id number] [place], [place] - [address]  [address], [place] [address] [place]  Phone:  [phone]  Fax:  [phone]   Next appt 10/18

## 2021-06-27 ENCOUNTER — Other Ambulatory Visit: Payer: Self-pay

## 2021-06-27 ENCOUNTER — Ambulatory Visit (INDEPENDENT_AMBULATORY_CARE_PROVIDER_SITE_OTHER): Payer: BC Managed Care – PPO | Admitting: Adult Health

## 2021-06-27 ENCOUNTER — Encounter: Payer: Self-pay | Admitting: Adult Health

## 2021-06-27 DIAGNOSIS — F319 Bipolar disorder, unspecified: Secondary | ICD-10-CM | POA: Diagnosis not present

## 2021-06-27 DIAGNOSIS — F4310 Post-traumatic stress disorder, unspecified: Secondary | ICD-10-CM | POA: Diagnosis not present

## 2021-06-27 DIAGNOSIS — F331 Major depressive disorder, recurrent, moderate: Secondary | ICD-10-CM

## 2021-06-27 DIAGNOSIS — F902 Attention-deficit hyperactivity disorder, combined type: Secondary | ICD-10-CM

## 2021-06-27 DIAGNOSIS — F411 Generalized anxiety disorder: Secondary | ICD-10-CM

## 2021-06-27 DIAGNOSIS — G4700 Insomnia, unspecified: Secondary | ICD-10-CM

## 2021-06-27 MED ORDER — AMPHETAMINE-DEXTROAMPHETAMINE 30 MG PO TABS: 30.0000 mg | ORAL_TABLET | Freq: Every day | ORAL | 0 refills | Status: DC

## 2021-06-27 MED ORDER — AMPHETAMINE-DEXTROAMPHETAMINE 30 MG PO TABS: 30.0000 mg | ORAL_TABLET | Freq: Two times a day (BID) | ORAL | 0 refills | Status: DC

## 2021-06-27 NOTE — Progress Notes (Signed)
Norman Abbott [id number] Sep 01, 1986 35 y.o.  Subjective:   Patient ID:  Norman Abbott is a 35 y.o. (DOB 14-Jun-1986) male.  Chief Complaint: No chief complaint on file.   HPI Norman Abbott presents to the office today for follow-up of PTSD, depression, anxiety, insomnia, bipolar disorder, and ADHD.  Describes mood today as "ok". Pleasant. Mood symptoms - denies depression, anxiety, and Irritability. Denies any outbursts. Feels like emotional support animal "bluie" has been helpful. Stating "I'm doing good". Feels like current medications continue to work well. Family doing well. Stable interest and motivation. Taking medications as prescribed.  Has gotten an emotional support dog and feel that helps keep him "calm".  Energy levels stable. Active, has have a regular exercise routine. Enjoys some usual interests and activities. Married. Lives with wife 2 children - 11 and 6. Brother local.  Appetite adequate. Weight stable - 189 pounds. Sleeps well most nights. Averages 7 to 8 hours. Focus and concentration stable with Adderall. Completing tasks. Managing aspects of household. Self employed. Denies SI or HI.  Denies AH or VH.      Review of Systems:  Review of Systems  Musculoskeletal:  Negative for gait problem.  Neurological:  Negative for tremors.  Psychiatric/Behavioral:         Please refer to HPI   Medications: I have reviewed the patient's current medications.  Current Outpatient Medications  Medication Sig Dispense Refill   amphetamine-dextroamphetamine (ADDERALL) 30 MG tablet Take 1 tablet by mouth daily. 60 tablet 0   [START ON 07/25/2021] amphetamine-dextroamphetamine (ADDERALL) 30 MG tablet Take 1 tablet by mouth 2 (two) times daily. 60 tablet 0   [START ON 08/22/2021] amphetamine-dextroamphetamine (ADDERALL) 30 MG tablet Take 1 tablet by mouth 2 (two) times daily. 60 tablet 0   ARIPiprazole (ABILIFY) 5 MG tablet Take 1 tablet (5 mg total) by mouth daily. 90 tablet 3    atorvastatin (LIPITOR) 10 MG tablet TAKE 1 TABLET BY MOUTH ONCE DAILY     dicyclomine (BENTYL) 20 MG tablet Take 1 tablet (20 mg total) by mouth 3 (three) times daily as needed (abdominal pain). 30 tablet 0   escitalopram (LEXAPRO) 10 MG tablet TAKE 1 AND 1/2 TABLETS BY  MOUTH ONCE A DAY 135 tablet 3   gabapentin (NEURONTIN) 300 MG capsule Take 1 capsule (300 mg total) by mouth at bedtime. 90 capsule 3   No current facility-administered medications for this visit.    Medication Side Effects: None  Allergies: No Known Allergies  Past Medical History:  Diagnosis Date   ADHD    Hyperlipidemia    PTSD (post-traumatic stress disorder)     Past Medical History, Surgical history, Social history, and Family history were reviewed and updated as appropriate.   Please see review of systems for further details on the patient's review from today.   Objective:   Physical Exam:  There were no vitals taken for this visit.  Physical Exam Constitutional:      General: He is not in acute distress. Musculoskeletal:        General: No deformity.  Neurological:     Mental Status: He is alert and oriented to person, place, and time.     Coordination: Coordination normal.  Psychiatric:        Attention and Perception: Attention and perception normal. He does not perceive auditory or visual hallucinations.        Mood and Affect: Mood normal. Mood is not anxious or depressed. Affect is not labile,  blunt, angry or inappropriate.        Speech: Speech normal.        Behavior: Behavior normal.        Thought Content: Thought content normal. Thought content is not paranoid or delusional. Thought content does not include homicidal or suicidal ideation. Thought content does not include homicidal or suicidal plan.        Cognition and Memory: Cognition and memory normal.        Judgment: Judgment normal.     Comments: Insight intact    Lab Review:     Component Value Date/Time   NA 139  06/09/2018 1715   K 4.0 06/09/2018 1715   CL 107 06/09/2018 1715   CO2 26 06/09/2018 1715   GLUCOSE 116 (H) 06/09/2018 1715   BUN 15 06/09/2018 1715   CREATININE 1.41 (H) 06/09/2018 1715   CALCIUM 8.6 (L) 06/09/2018 1715   PROT 7.3 06/09/2018 1715   ALBUMIN 3.5 06/09/2018 1715   AST 32 06/09/2018 1715   ALT 40 06/09/2018 1715   ALKPHOS 50 06/09/2018 1715   BILITOT 0.6 06/09/2018 1715   GFRNONAA >60 06/09/2018 1715   GFRAA >60 06/09/2018 1715       Component Value Date/Time   WBC 12.3 (H) 06/09/2018 1715   RBC 4.92 06/09/2018 1715   HGB 13.6 06/09/2018 1715   HCT 41.0 06/09/2018 1715   PLT 439 06/09/2018 1715   MCV 83.4 06/09/2018 1715   MCH 27.7 06/09/2018 1715   MCHC 33.2 06/09/2018 1715   RDW 14.4 06/09/2018 1715    No results found for: POCLITH, LITHIUM   No results found for: PHENYTOIN, PHENOBARB, VALPROATE, CBMZ   .res Assessment: Plan:    Plan:  Abilify 5mg  daily  Lexapro 15mg  daily Gabapentin 300mg  at bedtime   Adderall 30mg  BID  Pt reports BP 120/80 taking at home.  Continue therapy  RTC 3 months  Discussed potential metabolic side effects associated with atypical antipsychotics, as well as potential risk for movement side effects. Advised pt to contact office if movement side effects occur.   Discussed potential benefits, risks, and side effects of stimulants with patient to include increased heart rate, palpitations, insomnia, increased anxiety, increased irritability, or decreased appetite.  Instructed patient to contact office if experiencing any significant tolerability issues.  Diagnoses and all orders for this visit:  PTSD (post-traumatic stress disorder)  Attention deficit hyperactivity disorder (ADHD), combined type -     amphetamine-dextroamphetamine (ADDERALL) 30 MG tablet; Take 1 tablet by mouth daily. -     amphetamine-dextroamphetamine (ADDERALL) 30 MG tablet; Take 1 tablet by mouth 2 (two) times daily. -      amphetamine-dextroamphetamine (ADDERALL) 30 MG tablet; Take 1 tablet by mouth 2 (two) times daily.  Bipolar I disorder (HCC)  Generalized anxiety disorder  Insomnia, unspecified type  Major depressive disorder, recurrent episode, moderate (HCC)    Please see After Visit Summary for patient specific instructions.  Future Appointments  Date Time Provider Department Center  09/27/2021  8:00 AM Mckaila Duffus, , NP [facility] None     No orders of the defined types were placed in this encounter.   -------------------------------

## 2021-07-19 ENCOUNTER — Telehealth: Payer: Self-pay | Admitting: Adult Health

## 2021-07-19 NOTE — Telephone Encounter (Signed)
Pt LVM asking if he could go back to the Adderall XR 30mg  twice a day.  He said the instant release is not working for him.  He takes it at 7am and he's sleepy by 11am.  Next appt 1/18

## 2021-07-20 ENCOUNTER — Other Ambulatory Visit: Payer: Self-pay | Admitting: Adult Health

## 2021-07-20 DIAGNOSIS — F902 Attention-deficit hyperactivity disorder, combined type: Secondary | ICD-10-CM

## 2021-07-20 MED ORDER — AMPHETAMINE-DEXTROAMPHETAMINE 30 MG PO TABS: 30.0000 mg | ORAL_TABLET | Freq: Two times a day (BID) | ORAL | 0 refills | Status: DC

## 2021-07-20 NOTE — Telephone Encounter (Signed)
Please review

## 2021-07-20 NOTE — Telephone Encounter (Signed)
Pt called checking status on med change request from yesterday,

## 2021-07-20 NOTE — Telephone Encounter (Signed)
Scripts sent

## 2021-07-21 NOTE — Telephone Encounter (Signed)
Looks like regular Adderall was sent.  He said he wanted to go back to  Adderall XR.

## 2021-07-21 NOTE — Telephone Encounter (Signed)
Please see note.    Called patient, as Adderall XR is not on his med list and I didn't see it in recent notes from [place]. He states he used to take it and wanted to again. He states he will just take what we sent for now. He has an appt with [name] in January. Will mention it to her next week when she is back in the office.

## 2021-07-24 ENCOUNTER — Other Ambulatory Visit: Payer: Self-pay

## 2021-07-24 MED ORDER — AMPHETAMINE-DEXTROAMPHET ER 30 MG PO CP24: 30.0000 mg | ORAL_CAPSULE | Freq: Two times a day (BID) | ORAL | 0 refills | Status: DC

## 2021-07-24 NOTE — Telephone Encounter (Signed)
Yes. He said he was on XR previously and wants to go back to that. Ok with IR this time, but would like to switch next refill.

## 2021-07-24 NOTE — Telephone Encounter (Signed)
Noted. We can go ahead and pend the XR. Ty.

## 2021-07-24 NOTE — Telephone Encounter (Signed)
So he has the IR now and wants the XR?

## 2021-09-20 ENCOUNTER — Telehealth: Payer: Self-pay | Admitting: Adult Health

## 2021-09-20 NOTE — Telephone Encounter (Signed)
See message from 1/11 @10 :21 am (I signed the encounter by mistake so had to create this new encounter.

## 2021-09-20 NOTE — Telephone Encounter (Signed)
Pt LVM stating that he needs a PA for the Adderall IR  30mg  twice a day.  Next appt 1/18

## 2021-09-21 NOTE — Telephone Encounter (Signed)
PA was denied

## 2021-09-27 ENCOUNTER — Telehealth: Payer: Self-pay

## 2021-09-27 ENCOUNTER — Other Ambulatory Visit: Payer: Self-pay

## 2021-09-27 ENCOUNTER — Encounter: Payer: Self-pay | Admitting: Adult Health

## 2021-09-27 ENCOUNTER — Ambulatory Visit (INDEPENDENT_AMBULATORY_CARE_PROVIDER_SITE_OTHER): Payer: BC Managed Care – PPO | Admitting: Adult Health

## 2021-09-27 DIAGNOSIS — G4700 Insomnia, unspecified: Secondary | ICD-10-CM

## 2021-09-27 DIAGNOSIS — F319 Bipolar disorder, unspecified: Secondary | ICD-10-CM | POA: Diagnosis not present

## 2021-09-27 DIAGNOSIS — F411 Generalized anxiety disorder: Secondary | ICD-10-CM

## 2021-09-27 DIAGNOSIS — F902 Attention-deficit hyperactivity disorder, combined type: Secondary | ICD-10-CM | POA: Diagnosis not present

## 2021-09-27 DIAGNOSIS — F331 Major depressive disorder, recurrent, moderate: Secondary | ICD-10-CM | POA: Diagnosis not present

## 2021-09-27 MED ORDER — ESCITALOPRAM OXALATE 20 MG PO TABS: ORAL_TABLET | ORAL | 3 refills | Status: DC

## 2021-09-27 MED ORDER — ARIPIPRAZOLE 5 MG PO TABS: 5.0000 mg | ORAL_TABLET | Freq: Every day | ORAL | 3 refills | Status: DC

## 2021-09-27 MED ORDER — AMPHETAMINE-DEXTROAMPHETAMINE 30 MG PO TABS: 30.0000 mg | ORAL_TABLET | Freq: Two times a day (BID) | ORAL | 0 refills | Status: DC

## 2021-09-27 MED ORDER — GABAPENTIN 300 MG PO CAPS: 300.0000 mg | ORAL_CAPSULE | Freq: Every day | ORAL | 3 refills | Status: DC

## 2021-09-27 NOTE — Progress Notes (Signed)
Norman Abbott [id number] 11-29-1985 36 y.o.  Subjective:   Patient ID:  Norman Abbott is a 36 y.o. (DOB Mar 26, 1986) male.  Chief Complaint: No chief complaint on file.   HPI Norman Abbott presents to the office today for follow-up of PTSD, depression, anxiety, insomnia, bipolar disorder, and ADHD.  Describes mood today as "ok". Pleasant. Mood symptoms - denies depression, anxiety, and Irritability. Denies any outbursts. Stating "things are going good". Feels like current medications continue to work well. Family doing well. Stable interest and motivation. Taking medications as prescribed.  Energy levels stable. Active, has have a regular exercise routine. Enjoys some usual interests and activities. Married. Lives with wife 2 children - 11 and 6 and emotional support dog "bluie". Brother local.  Appetite adequate. Weight stable - 189 pounds. Sleeps well most nights. Averages 8 hours. Focus and concentration stable with Adderall. Completing tasks. Managing aspects of household. Self employed. [employer] part-time. Denies SI or HI.  Denies AH or VH.      Review of Systems:  Review of Systems  Musculoskeletal:  Negative for gait problem.  Neurological:  Negative for tremors.  Psychiatric/Behavioral:         Please refer to HPI   Medications: I have reviewed the patient's current medications.  Current Outpatient Medications  Medication Sig Dispense Refill   amphetamine-dextroamphetamine (ADDERALL XR) 30 MG 24 hr capsule Take 1 capsule (30 mg total) by mouth 2 (two) times daily. 60 capsule 0   amphetamine-dextroamphetamine (ADDERALL) 30 MG tablet Take 1 tablet by mouth 2 (two) times daily. 60 tablet 0   [START ON 10/25/2021] amphetamine-dextroamphetamine (ADDERALL) 30 MG tablet Take 1 tablet by mouth 2 (two) times daily. 60 tablet 0   [START ON 11/22/2021] amphetamine-dextroamphetamine (ADDERALL) 30 MG tablet Take 1 tablet by mouth 2 (two) times daily. 60 tablet 0   ARIPiprazole (ABILIFY) 5  MG tablet Take 1 tablet (5 mg total) by mouth daily. 90 tablet 3   atorvastatin (LIPITOR) 10 MG tablet TAKE 1 TABLET BY MOUTH ONCE DAILY     dicyclomine (BENTYL) 20 MG tablet Take 1 tablet (20 mg total) by mouth 3 (three) times daily as needed (abdominal pain). 30 tablet 0   escitalopram (LEXAPRO) 20 MG tablet TAKE ONE TABLET  DAILY 90 tablet 3   gabapentin (NEURONTIN) 300 MG capsule Take 1 capsule (300 mg total) by mouth at bedtime. 90 capsule 3   No current facility-administered medications for this visit.    Medication Side Effects: None  Allergies: No Known Allergies  Past Medical History:  Diagnosis Date   ADHD    Hyperlipidemia    PTSD (post-traumatic stress disorder)     Past Medical History, Surgical history, Social history, and Family history were reviewed and updated as appropriate.   Please see review of systems for further details on the patient's review from today.   Objective:   Physical Exam:  There were no vitals taken for this visit.  Physical Exam Constitutional:      General: He is not in acute distress. Musculoskeletal:        General: No deformity.  Neurological:     Mental Status: He is alert and oriented to person, place, and time.     Coordination: Coordination normal.  Psychiatric:        Attention and Perception: Attention and perception normal. He does not perceive auditory or visual hallucinations.        Mood and Affect: Mood normal. Mood is not anxious or  depressed. Affect is not labile, blunt, angry or inappropriate.        Speech: Speech normal.        Behavior: Behavior normal.        Thought Content: Thought content normal. Thought content is not paranoid or delusional. Thought content does not include homicidal or suicidal ideation. Thought content does not include homicidal or suicidal plan.        Cognition and Memory: Cognition and memory normal.        Judgment: Judgment normal.     Comments: Insight intact    Lab Review:      Component Value Date/Time   NA 139 06/09/2018 1715   K 4.0 06/09/2018 1715   CL 107 06/09/2018 1715   CO2 26 06/09/2018 1715   GLUCOSE 116 (H) 06/09/2018 1715   BUN 15 06/09/2018 1715   CREATININE 1.41 (H) 06/09/2018 1715   CALCIUM 8.6 (L) 06/09/2018 1715   PROT 7.3 06/09/2018 1715   ALBUMIN 3.5 06/09/2018 1715   AST 32 06/09/2018 1715   ALT 40 06/09/2018 1715   ALKPHOS 50 06/09/2018 1715   BILITOT 0.6 06/09/2018 1715   GFRNONAA >60 06/09/2018 1715   GFRAA >60 06/09/2018 1715       Component Value Date/Time   WBC 12.3 (H) 06/09/2018 1715   RBC 4.92 06/09/2018 1715   HGB 13.6 06/09/2018 1715   HCT 41.0 06/09/2018 1715   PLT 439 06/09/2018 1715   MCV 83.4 06/09/2018 1715   MCH 27.7 06/09/2018 1715   MCHC 33.2 06/09/2018 1715   RDW 14.4 06/09/2018 1715    No results found for: POCLITH, LITHIUM   No results found for: PHENYTOIN, PHENOBARB, VALPROATE, CBMZ   .res Assessment: Plan:    Plan:  Abilify 5mg  daily  Lexapro 20mg  daily Gabapentin 300mg  at bedtime   Adderall 30mg  BID  121/84/87  Continue therapy  RTC 3 months  Discussed potential metabolic side effects associated with atypical antipsychotics, as well as potential risk for movement side effects. Advised pt to contact office if movement side effects occur.   Discussed potential benefits, risks, and side effects of stimulants with patient to include increased heart rate, palpitations, insomnia, increased anxiety, increased irritability, or decreased appetite.  Instructed patient to contact office if experiencing any significant tolerability issues. Diagnoses and all orders for this visit:  Major depressive disorder, recurrent episode, moderate (HCC) -     escitalopram (LEXAPRO) 20 MG tablet; TAKE ONE TABLET  DAILY  Generalized anxiety disorder -     escitalopram (LEXAPRO) 20 MG tablet; TAKE ONE TABLET  DAILY  Bipolar I disorder (HCC) -     ARIPiprazole (ABILIFY) 5 MG tablet; Take 1 tablet (5 mg total)  by mouth daily.  Attention deficit hyperactivity disorder (ADHD), combined type -     amphetamine-dextroamphetamine (ADDERALL) 30 MG tablet; Take 1 tablet by mouth 2 (two) times daily. -     amphetamine-dextroamphetamine (ADDERALL) 30 MG tablet; Take 1 tablet by mouth 2 (two) times daily. -     amphetamine-dextroamphetamine (ADDERALL) 30 MG tablet; Take 1 tablet by mouth 2 (two) times daily.  Insomnia, unspecified type -     gabapentin (NEURONTIN) 300 MG capsule; Take 1 capsule (300 mg total) by mouth at bedtime.     Please see After Visit Summary for patient specific instructions.  No future appointments.  No orders of the defined types were placed in this encounter.   -------------------------------

## 2021-09-27 NOTE — Telephone Encounter (Signed)
Noted  

## 2021-09-27 NOTE — Telephone Encounter (Signed)
Prior Authorization resubmitted for AMPHETAMINE-DEXTROAMPHETAMINE 30 MG #60 approved effective 09/27/2021-09/27/2022 with [facility] PA# [id number]  [employer]

## 2021-12-14 ENCOUNTER — Telehealth: Payer: Self-pay | Admitting: Adult Health

## 2021-12-14 NOTE — Telephone Encounter (Signed)
Pt called at 4:07 pm and asked for a refill on his adderall  30 mg. [facility] [address] [facility] [place] ?

## 2021-12-14 NOTE — Telephone Encounter (Signed)
XR or IR ?

## 2021-12-18 ENCOUNTER — Other Ambulatory Visit: Payer: Self-pay | Admitting: Adult Health

## 2021-12-18 DIAGNOSIS — F902 Attention-deficit hyperactivity disorder, combined type: Secondary | ICD-10-CM

## 2021-12-18 MED ORDER — AMPHETAMINE-DEXTROAMPHETAMINE 30 MG PO TABS: 30.0000 mg | ORAL_TABLET | Freq: Two times a day (BID) | ORAL | 0 refills | Status: DC

## 2021-12-18 NOTE — Telephone Encounter (Signed)
Script sent  

## 2021-12-18 NOTE — Telephone Encounter (Signed)
PT is in need of the IR Adderall 30mg . He understands that he didn't provide enough time for a RF before Friday. ?

## 2021-12-26 ENCOUNTER — Encounter: Payer: Self-pay | Admitting: Adult Health

## 2021-12-26 ENCOUNTER — Ambulatory Visit (INDEPENDENT_AMBULATORY_CARE_PROVIDER_SITE_OTHER): Payer: BC Managed Care – PPO | Admitting: Adult Health

## 2021-12-26 DIAGNOSIS — G4700 Insomnia, unspecified: Secondary | ICD-10-CM

## 2021-12-26 DIAGNOSIS — F411 Generalized anxiety disorder: Secondary | ICD-10-CM

## 2021-12-26 DIAGNOSIS — F319 Bipolar disorder, unspecified: Secondary | ICD-10-CM

## 2021-12-26 DIAGNOSIS — F4310 Post-traumatic stress disorder, unspecified: Secondary | ICD-10-CM

## 2021-12-26 DIAGNOSIS — F331 Major depressive disorder, recurrent, moderate: Secondary | ICD-10-CM

## 2021-12-26 DIAGNOSIS — F902 Attention-deficit hyperactivity disorder, combined type: Secondary | ICD-10-CM

## 2021-12-26 MED ORDER — CLONIDINE HCL 0.1 MG PO TABS: 0.1000 mg | ORAL_TABLET | Freq: Every day | ORAL | 3 refills | Status: DC

## 2021-12-26 MED ORDER — AMPHETAMINE-DEXTROAMPHETAMINE 30 MG PO TABS: 30.0000 mg | ORAL_TABLET | Freq: Two times a day (BID) | ORAL | 0 refills | Status: DC

## 2021-12-26 NOTE — Progress Notes (Signed)
DEMITRIOS MOLYNEUX ?[id number] ?[birth year] ?36 y.o. ? ?Subjective:  ? ?Patient ID:  Norman Abbott is a 36 y.o. (DOB [birth year]) male. ? ?Chief Complaint: No chief complaint on file. ? ? ?HPI ?Norman Abbott presents to the office today for follow-up of PTSD, depression, anxiety, insomnia, bipolar disorder, and ADHD. ? ?Describes mood today as "ok". Pleasant. Mood symptoms - denies depression and anxiety. Increased Irritability - more so lately. Reports increased outbursts. Stating "I'm struggling with more irritability". Asking about adding Clonidine to medication. Feels like other medications continue to work well. Family doing well. Stable interest and motivation. Taking medications as prescribed.  ?Energy levels stable. Active, has have a regular exercise routine. ?Enjoys some usual interests and activities. Married. Lives with wife 2 children and emotional support dog "bluie". Brother local.  ?Appetite adequate. Weight stable - 189 pounds. ?Sleeps well most nights. Averages 6 to 7 hours. ?Focus and concentration stable with Adderall. Completing tasks. Managing aspects of household. Self employed. [employer] part-time. ?Denies SI or HI.  ?Denies AH or VH. ? ?Review of Systems:  ?Review of Systems  ?Musculoskeletal:  Negative for gait problem.  ?Neurological:  Negative for tremors.  ?Psychiatric/Behavioral:    ?     Please refer to HPI  ? ?Medications: I have reviewed the patient's current medications. ? ?Current Outpatient Medications  ?Medication Sig Dispense Refill  ? cloNIDine (CATAPRES) 0.1 MG tablet Take 1 tablet (0.1 mg total) by mouth at bedtime. 90 tablet 3  ? amphetamine-dextroamphetamine (ADDERALL XR) 30 MG 24 hr capsule Take 1 capsule (30 mg total) by mouth 2 (two) times daily. 60 capsule 0  ? amphetamine-dextroamphetamine (ADDERALL) 30 MG tablet Take 1 tablet by mouth 2 (two) times daily. 60 tablet 0  ? [START ON 01/23/2022] amphetamine-dextroamphetamine (ADDERALL) 30 MG tablet Take 1 tablet by mouth 2 (two) times  daily. 60 tablet 0  ? [START ON 02/20/2022] amphetamine-dextroamphetamine (ADDERALL) 30 MG tablet Take 1 tablet by mouth 2 (two) times daily. 60 tablet 0  ? ARIPiprazole (ABILIFY) 5 MG tablet Take 1 tablet (5 mg total) by mouth daily. 90 tablet 3  ? atorvastatin (LIPITOR) 10 MG tablet TAKE 1 TABLET BY MOUTH ONCE DAILY    ? dicyclomine (BENTYL) 20 MG tablet Take 1 tablet (20 mg total) by mouth 3 (three) times daily as needed (abdominal pain). 30 tablet 0  ? escitalopram (LEXAPRO) 20 MG tablet TAKE ONE TABLET  DAILY 90 tablet 3  ? gabapentin (NEURONTIN) 300 MG capsule Take 1 capsule (300 mg total) by mouth at bedtime. 90 capsule 3  ? ?No current facility-administered medications for this visit.  ? ? ?Medication Side Effects: None ? ?Allergies: No Known Allergies ? ?Past Medical History:  ?Diagnosis Date  ? ADHD   ? Hyperlipidemia   ? PTSD (post-traumatic stress disorder)   ? ? ?Past Medical History, Surgical history, Social history, and Family history were reviewed and updated as appropriate.  ? ?Please see review of systems for further details on the patient's review from today.  ? ?Objective:  ? ?Physical Exam:  ?There were no vitals taken for this visit. ? ?Physical Exam ?Constitutional:   ?   General: He is not in acute distress. ?Musculoskeletal:     ?   General: No deformity.  ?Neurological:  ?   Mental Status: He is alert and oriented to person, place, and time.  ?   Coordination: Coordination normal.  ?Psychiatric:     ?   Attention and Perception: Attention and perception  normal. He does not perceive auditory or visual hallucinations.     ?   Mood and Affect: Mood normal. Mood is not anxious or depressed. Affect is not labile, blunt, angry or inappropriate.     ?   Speech: Speech normal.     ?   Behavior: Behavior normal.     ?   Thought Content: Thought content normal. Thought content is not paranoid or delusional. Thought content does not include homicidal or suicidal ideation. Thought content does not  include homicidal or suicidal plan.     ?   Cognition and Memory: Cognition and memory normal.     ?   Judgment: Judgment normal.  ?   Comments: Insight intact  ? ? ?Lab Review:  ?   ?Component Value Date/Time  ? NA 139 06/09/2018 1715  ? K 4.0 06/09/2018 1715  ? CL 107 06/09/2018 1715  ? CO2 26 06/09/2018 1715  ? GLUCOSE 116 (H) 06/09/2018 1715  ? BUN 15 06/09/2018 1715  ? CREATININE 1.41 (H) 06/09/2018 1715  ? CALCIUM 8.6 (L) 06/09/2018 1715  ? PROT 7.3 06/09/2018 1715  ? ALBUMIN 3.5 06/09/2018 1715  ? AST 32 06/09/2018 1715  ? ALT 40 06/09/2018 1715  ? ALKPHOS 50 06/09/2018 1715  ? BILITOT 0.6 06/09/2018 1715  ? GFRNONAA >60 06/09/2018 1715  ? GFRAA >60 06/09/2018 1715  ? ? ?   ?Component Value Date/Time  ? WBC 12.3 (H) 06/09/2018 1715  ? RBC 4.92 06/09/2018 1715  ? HGB 13.6 06/09/2018 1715  ? HCT 41.0 06/09/2018 1715  ? PLT 439 06/09/2018 1715  ? MCV 83.4 06/09/2018 1715  ? MCH 27.7 06/09/2018 1715  ? MCHC 33.2 06/09/2018 1715  ? RDW 14.4 06/09/2018 1715  ? ? ?No results found for: POCLITH, LITHIUM  ? ?No results found for: PHENYTOIN, PHENOBARB, VALPROATE, CBMZ  ? ?.res ?Assessment: Plan:   ? ?Plan: ? ?Abilify 5mg  daily  ?Lexapro 20mg  daily ?Gabapentin 300mg  at bedtime   ?Adderall 30mg  BID ? ?Add Clonidine 0.1mg  a bedtime ? ?142/104 88 - monitor between visits ? ?Continue therapy ? ?RTC 3 months ? ?Discussed potential metabolic side effects associated with atypical antipsychotics, as well as potential risk for movement side effects. Advised pt to contact office if movement side effects occur.  ? ?Discussed potential benefits, risks, and side effects of stimulants with patient to include increased heart rate, palpitations, insomnia, increased anxiety, increased irritability, or decreased appetite.  Instructed patient to contact office if experiencing any significant tolerability issues. ? ?Diagnoses and all orders for this visit: ? ?Bipolar I disorder (HCC) ?-     cloNIDine (CATAPRES) 0.1 MG tablet; Take 1 tablet  (0.1 mg total) by mouth at bedtime. ? ?Attention deficit hyperactivity disorder (ADHD), combined type ?-     amphetamine-dextroamphetamine (ADDERALL) 30 MG tablet; Take 1 tablet by mouth 2 (two) times daily. ?-     amphetamine-dextroamphetamine (ADDERALL) 30 MG tablet; Take 1 tablet by mouth 2 (two) times daily. ?-     amphetamine-dextroamphetamine (ADDERALL) 30 MG tablet; Take 1 tablet by mouth 2 (two) times daily. ? ?Major depressive disorder, recurrent episode, moderate (HCC) ? ?Generalized anxiety disorder ? ?PTSD (post-traumatic stress disorder) ? ?Insomnia, unspecified type ? ?  ? ?Please see After Visit Summary for patient specific instructions. ? ?No future appointments. ? ?No orders of the defined types were placed in this encounter. ? ? ?------------------------------- ?

## 2022-03-14 ENCOUNTER — Telehealth: Payer: Self-pay

## 2022-03-14 ENCOUNTER — Telehealth: Payer: Self-pay | Admitting: Adult Health

## 2022-03-14 NOTE — Telephone Encounter (Unsigned)
Pt would like [facility] [place].  He is requesting 20mg  for 3 times a day.  They have that dosage in stock.    He said he picked up last script on 6/5, but system shows different.  Next appt 7/18

## 2022-03-14 NOTE — Telephone Encounter (Signed)
Pt has rx at pharmacy 

## 2022-03-15 ENCOUNTER — Other Ambulatory Visit: Payer: Self-pay

## 2022-03-15 MED ORDER — AMPHETAMINE-DEXTROAMPHETAMINE 20 MG PO TABS: 20.0000 mg | ORAL_TABLET | Freq: Three times a day (TID) | ORAL | 0 refills | Status: DC

## 2022-03-15 NOTE — Telephone Encounter (Signed)
Dose change ok?Or do you want him to find another pharmacy

## 2022-03-15 NOTE — Telephone Encounter (Signed)
Pended.

## 2022-03-15 NOTE — Telephone Encounter (Signed)
Ok to pend.

## 2022-03-15 NOTE — Telephone Encounter (Signed)
Patient called back regarding previous message. States that pharmacy doesn't have regular dosage in stock. They do have 20mg  in stock and he needs prescription sent for 20mg . Ph: [phone] Appt 7/18 [facility] [address] [place], [address]

## 2022-03-27 ENCOUNTER — Ambulatory Visit (INDEPENDENT_AMBULATORY_CARE_PROVIDER_SITE_OTHER): Payer: Self-pay | Admitting: Adult Health

## 2022-03-27 DIAGNOSIS — F489 Nonpsychotic mental disorder, unspecified: Secondary | ICD-10-CM

## 2022-03-27 NOTE — Progress Notes (Signed)
Patient no show appointment. ? ?

## 2022-06-11 ENCOUNTER — Telehealth: Payer: Self-pay | Admitting: Adult Health

## 2022-06-11 ENCOUNTER — Other Ambulatory Visit: Payer: Self-pay

## 2022-06-11 NOTE — Telephone Encounter (Signed)
Patient has an appt with Gina tomorrow.  ?

## 2022-06-11 NOTE — Telephone Encounter (Signed)
Next appt is 06/12/22. Requesting Adderall 30 mg called to:  [id number] [place], [place] - [address]  Phone:  [phone]  Fax:  [phone]    It is in stock per patient.

## 2022-06-12 ENCOUNTER — Encounter: Payer: Self-pay | Admitting: Adult Health

## 2022-06-12 ENCOUNTER — Ambulatory Visit (INDEPENDENT_AMBULATORY_CARE_PROVIDER_SITE_OTHER): Payer: BC Managed Care – PPO | Admitting: Adult Health

## 2022-06-12 DIAGNOSIS — F411 Generalized anxiety disorder: Secondary | ICD-10-CM | POA: Diagnosis not present

## 2022-06-12 DIAGNOSIS — G4700 Insomnia, unspecified: Secondary | ICD-10-CM

## 2022-06-12 DIAGNOSIS — F902 Attention-deficit hyperactivity disorder, combined type: Secondary | ICD-10-CM | POA: Diagnosis not present

## 2022-06-12 DIAGNOSIS — F4310 Post-traumatic stress disorder, unspecified: Secondary | ICD-10-CM

## 2022-06-12 DIAGNOSIS — F319 Bipolar disorder, unspecified: Secondary | ICD-10-CM | POA: Diagnosis not present

## 2022-06-12 DIAGNOSIS — F331 Major depressive disorder, recurrent, moderate: Secondary | ICD-10-CM

## 2022-06-12 MED ORDER — AMPHETAMINE-DEXTROAMPHETAMINE 30 MG PO TABS: 30.0000 mg | ORAL_TABLET | Freq: Two times a day (BID) | ORAL | 0 refills | Status: DC

## 2022-06-12 NOTE — Telephone Encounter (Signed)
Rx was sent by Barnett Applebaum.

## 2022-06-12 NOTE — Progress Notes (Signed)
Norman Abbott [id number] 20-May-1986 36 y.o.  Virtual Visit via Telephone Note  I connected with pt on 06/12/22 at  9:00 AM EDT by telephone and verified that I am speaking with the correct person using two identifiers.   I discussed the limitations, risks, security and privacy concerns of performing an evaluation and management service by telephone and the availability of in person appointments. I also discussed with the patient that there may be a patient responsible charge related to this service. The patient expressed understanding and agreed to proceed.   I discussed the assessment and treatment plan with the patient. The patient was provided an opportunity to ask questions and all were answered. The patient agreed with the plan and demonstrated an understanding of the instructions.   The patient was advised to call back or seek an in-person evaluation if the symptoms worsen or if the condition fails to improve as anticipated.  I provided 25 minutes of non-face-to-face time during this encounter.  The patient was located at home.  The provider was located at [facility].   Dorothyann Gibbs, NP   Subjective:   Patient ID:  Norman Abbott is a 36 y.o. (DOB 09-17-1985) male.  Chief Complaint: No chief complaint on file.   HPI CORLEONE BIEGLER presents for follow-up of PTSD, depression, anxiety, insomnia, bipolar disorder, and ADHD.  Describes mood today as "ok". Pleasant. Mood symptoms - denies depression and anxiety. Decrease irritability. Denies recent outbursts. Mood is consistent. Stating "I feel like I'm doing ok". Feels like other medications continue to work well. Family doing well. Stable interest and motivation. Taking medications as prescribed.  Energy levels stable. Active, has have a regular exercise routine. Enjoys some usual interests and activities. Married. Lives with wife 2 children and emotional support dog "bluie". Brother local.  Appetite adequate. Weight  stable - 185 pounds. Sleeps well most nights. Averages 6 to 7 hours. Focus and concentration stable with Adderall. Completing tasks. Managing aspects of household. Self employed. [employer] part-time. Denies SI or HI.  Denies AH or VH.   Review of Systems:  Review of Systems  Musculoskeletal:  Negative for gait problem.  Neurological:  Negative for tremors.  Psychiatric/Behavioral:         Please refer to HPI    Medications: I have reviewed the patient's current medications.  Current Outpatient Medications  Medication Sig Dispense Refill   amphetamine-dextroamphetamine (ADDERALL) 30 MG tablet Take 1 tablet by mouth 2 (two) times daily. 60 tablet 0   [START ON 07/10/2022] amphetamine-dextroamphetamine (ADDERALL) 30 MG tablet Take 1 tablet by mouth 2 (two) times daily. 60 tablet 0   [START ON 08/07/2022] amphetamine-dextroamphetamine (ADDERALL) 30 MG tablet Take 1 tablet by mouth 2 (two) times daily. 60 tablet 0   ARIPiprazole (ABILIFY) 5 MG tablet Take 1 tablet (5 mg total) by mouth daily. 90 tablet 3   atorvastatin (LIPITOR) 10 MG tablet TAKE 1 TABLET BY MOUTH ONCE DAILY     cloNIDine (CATAPRES) 0.1 MG tablet Take 1 tablet (0.1 mg total) by mouth at bedtime. 90 tablet 3   dicyclomine (BENTYL) 20 MG tablet Take 1 tablet (20 mg total) by mouth 3 (three) times daily as needed (abdominal pain). 30 tablet 0   escitalopram (LEXAPRO) 20 MG tablet TAKE ONE TABLET  DAILY 90 tablet 3   gabapentin (NEURONTIN) 300 MG capsule Take 1 capsule (300 mg total) by mouth at bedtime. 90 capsule 3   No current facility-administered medications for this visit.  Medication Side Effects: None  Allergies: No Known Allergies  Past Medical History:  Diagnosis Date   ADHD    Hyperlipidemia    PTSD (post-traumatic stress disorder)     No family history on file.  Social History   Socioeconomic History   Marital status: Single    Spouse name: Not on file   Number of children: Not on file   Years of  education: Not on file   Highest education level: Not on file  Occupational History   Not on file  Tobacco Use   Smoking status: Never   Smokeless tobacco: Never  Vaping Use   Vaping Use: Never used  Substance and Sexual Activity   Alcohol use: Not on file   Drug use: Not on file   Sexual activity: Not on file  Other Topics Concern   Not on file  Social History Narrative   Not on file   Social Determinants of Health   Financial Resource Strain: Not on file  Food Insecurity: Not on file  Transportation Needs: Not on file  Physical Activity: Not on file  Stress: Not on file  Social Connections: Not on file  Intimate Partner Violence: Not on file    Past Medical History, Surgical history, Social history, and Family history were reviewed and updated as appropriate.   Please see review of systems for further details on the patient's review from today.   Objective:   Physical Exam:  There were no vitals taken for this visit.  Physical Exam Neurological:     Mental Status: He is alert and oriented to person, place, and time.     Cranial Nerves: No dysarthria.  Psychiatric:        Attention and Perception: Attention and perception normal.        Mood and Affect: Mood normal.        Speech: Speech normal.        Behavior: Behavior is cooperative.        Thought Content: Thought content normal. Thought content is not paranoid or delusional. Thought content does not include homicidal or suicidal ideation. Thought content does not include homicidal or suicidal plan.        Cognition and Memory: Cognition and memory normal.        Judgment: Judgment normal.     Comments: Insight intact     Lab Review:     Component Value Date/Time   NA 139 06/09/2018 1715   K 4.0 06/09/2018 1715   CL 107 06/09/2018 1715   CO2 26 06/09/2018 1715   GLUCOSE 116 (H) 06/09/2018 1715   BUN 15 06/09/2018 1715   CREATININE 1.41 (H) 06/09/2018 1715   CALCIUM 8.6 (L) 06/09/2018 1715   PROT  7.3 06/09/2018 1715   ALBUMIN 3.5 06/09/2018 1715   AST 32 06/09/2018 1715   ALT 40 06/09/2018 1715   ALKPHOS 50 06/09/2018 1715   BILITOT 0.6 06/09/2018 1715   GFRNONAA >60 06/09/2018 1715   GFRAA >60 06/09/2018 1715       Component Value Date/Time   WBC 12.3 (H) 06/09/2018 1715   RBC 4.92 06/09/2018 1715   HGB 13.6 06/09/2018 1715   HCT 41.0 06/09/2018 1715   PLT 439 06/09/2018 1715   MCV 83.4 06/09/2018 1715   MCH 27.7 06/09/2018 1715   MCHC 33.2 06/09/2018 1715   RDW 14.4 06/09/2018 1715    No results found for: "POCLITH", "LITHIUM"   No results found for: "PHENYTOIN", "PHENOBARB", "VALPROATE", "CBMZ"   .  res Assessment: Plan:    Plan:  Abilify 5mg  daily  Lexapro 20mg  daily Gabapentin 300mg  at bedtime   Adderall 30mg  BID Clonidine 0.1mg  a bedtime  Monitor BP between visits while taking stimulant medication - 127/76.  Continue therapy  RTC 3 months  Discussed potential metabolic side effects associated with atypical antipsychotics, as well as potential risk for movement side effects. Advised pt to contact office if movement side effects occur.   Discussed potential benefits, risks, and side effects of stimulants with patient to include increased heart rate, palpitations, insomnia, increased anxiety, increased irritability, or decreased appetite.  Instructed patient to contact office if experiencing any significant tolerability issues.  Diagnoses and all orders for this visit:  Bipolar I disorder (HCC)  Attention deficit hyperactivity disorder (ADHD), combined type -     amphetamine-dextroamphetamine (ADDERALL) 30 MG tablet; Take 1 tablet by mouth 2 (two) times daily. -     amphetamine-dextroamphetamine (ADDERALL) 30 MG tablet; Take 1 tablet by mouth 2 (two) times daily. -     amphetamine-dextroamphetamine (ADDERALL) 30 MG tablet; Take 1 tablet by mouth 2 (two) times daily.  PTSD (post-traumatic stress disorder)  Generalized anxiety disorder  Major  depressive disorder, recurrent episode, moderate (HCC)  Insomnia, unspecified type    Please see After Visit Summary for patient specific instructions.  No future appointments.   No orders of the defined types were placed in this encounter.     -------------------------------

## 2022-07-04 ENCOUNTER — Other Ambulatory Visit: Payer: Self-pay | Admitting: Adult Health

## 2022-07-04 DIAGNOSIS — F319 Bipolar disorder, unspecified: Secondary | ICD-10-CM

## 2022-09-07 ENCOUNTER — Other Ambulatory Visit: Payer: Self-pay

## 2022-09-07 ENCOUNTER — Telehealth: Payer: Self-pay | Admitting: Adult Health

## 2022-09-07 DIAGNOSIS — F902 Attention-deficit hyperactivity disorder, combined type: Secondary | ICD-10-CM

## 2022-09-07 MED ORDER — AMPHETAMINE-DEXTROAMPHETAMINE 30 MG PO TABS: 30.0000 mg | ORAL_TABLET | Freq: Two times a day (BID) | ORAL | 0 refills | Status: DC

## 2022-09-07 NOTE — Telephone Encounter (Signed)
Pended.

## 2022-09-07 NOTE — Telephone Encounter (Signed)
Pt requesting Rx for generic adderall 30 mg 2/d [facility] [place] . [address]

## 2022-09-08 ENCOUNTER — Other Ambulatory Visit: Payer: Self-pay | Admitting: Adult Health

## 2022-09-08 DIAGNOSIS — F331 Major depressive disorder, recurrent, moderate: Secondary | ICD-10-CM

## 2022-09-08 DIAGNOSIS — F411 Generalized anxiety disorder: Secondary | ICD-10-CM

## 2022-09-21 ENCOUNTER — Encounter: Payer: Self-pay | Admitting: Adult Health

## 2022-09-21 ENCOUNTER — Ambulatory Visit (INDEPENDENT_AMBULATORY_CARE_PROVIDER_SITE_OTHER): Payer: BC Managed Care – PPO | Admitting: Adult Health

## 2022-09-21 DIAGNOSIS — F331 Major depressive disorder, recurrent, moderate: Secondary | ICD-10-CM

## 2022-09-21 DIAGNOSIS — F319 Bipolar disorder, unspecified: Secondary | ICD-10-CM

## 2022-09-21 DIAGNOSIS — F902 Attention-deficit hyperactivity disorder, combined type: Secondary | ICD-10-CM

## 2022-09-21 DIAGNOSIS — F411 Generalized anxiety disorder: Secondary | ICD-10-CM | POA: Diagnosis not present

## 2022-09-21 DIAGNOSIS — G4700 Insomnia, unspecified: Secondary | ICD-10-CM

## 2022-09-21 MED ORDER — AMPHETAMINE-DEXTROAMPHETAMINE 30 MG PO TABS: 30.0000 mg | ORAL_TABLET | Freq: Two times a day (BID) | ORAL | 0 refills | Status: DC

## 2022-09-21 MED ORDER — CLONIDINE HCL 0.1 MG PO TABS: 0.1000 mg | ORAL_TABLET | Freq: Every day | ORAL | 3 refills | Status: DC

## 2022-09-21 MED ORDER — ARIPIPRAZOLE 5 MG PO TABS: 5.0000 mg | ORAL_TABLET | Freq: Every day | ORAL | 3 refills | Status: DC

## 2022-09-21 MED ORDER — GABAPENTIN 300 MG PO CAPS: 300.0000 mg | ORAL_CAPSULE | Freq: Every day | ORAL | 3 refills | Status: DC

## 2022-09-21 MED ORDER — AMPHETAMINE-DEXTROAMPHETAMINE 20 MG PO TABS: ORAL_TABLET | ORAL | 0 refills | Status: DC

## 2022-09-21 MED ORDER — ESCITALOPRAM OXALATE 20 MG PO TABS: ORAL_TABLET | ORAL | 3 refills | Status: DC

## 2022-09-21 NOTE — Progress Notes (Signed)
Norman Abbott [id number] 10-Apr-1986 37 y.o.  Virtual Visit via Telephone Note  I connected with pt on 09/21/22 at  8:00 AM EST by telephone and verified that I am speaking with the correct person using two identifiers.   I discussed the limitations, risks, security and privacy concerns of performing an evaluation and management service by telephone and the availability of in person appointments. I also discussed with the patient that there may be a patient responsible charge related to this service. The patient expressed understanding and agreed to proceed.   I discussed the assessment and treatment plan with the patient. The patient was provided an opportunity to ask questions and all were answered. The patient agreed with the plan and demonstrated an understanding of the instructions.   The patient was advised to call back or seek an in-person evaluation if the symptoms worsen or if the condition fails to improve as anticipated.  I provided 25 minutes of non-face-to-face time during this encounter.  The patient was located at home.  The provider was located at [place].   Aloha Gell, NP   Subjective:   Patient ID:  Norman Abbott is a 38 y.o. (DOB 22-Aug-1986) male.  Chief Complaint: No chief complaint on file.   HPI Norman Abbott presents for follow-up of PTSD, depression, anxiety, insomnia, bipolar disorder, and ADHD.  Describes mood today as "ok". Pleasant. Mood symptoms - denies depression. Reports anxiety at times. Denies irritability. Denies recent outbursts. Mood is consistent. Stating "my mood couldn't be better". Feels like other medications continue to work well. Family doing well. Stable interest and motivation. Taking medications as prescribed.  Energy levels stable. Active, has have a regular exercise routine. Enjoys some usual interests and activities. Married. Lives with wife 2 children and emotional support dog "bluie". Brother local.  Appetite  adequate. Weight stable - 185 pounds. Sleeps well most nights. Averages 8 hours. Focus and concentration stable with Adderall. Completing tasks. Managing aspects of household. 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 Reports losing job at [employer].  Denies SI or HI.  Denies AH or VH. Denies self harm. Denies substance use.  Review of Systems:  Review of Systems  Musculoskeletal:  Negative for gait problem.  Neurological:  Negative for tremors.  Psychiatric/Behavioral:         Please refer to HPI    Medications: I have reviewed the patient's current medications.  Current Outpatient Medications  Medication Sig Dispense Refill   amphetamine-dextroamphetamine (ADDERALL) 20 MG tablet Take 1/2 to one tablet in the afternoon. 30 tablet 0   amphetamine-dextroamphetamine (ADDERALL) 30 MG tablet Take 1 tablet by mouth 2 (two) times daily. 60 tablet 0   [START ON 10/19/2022] amphetamine-dextroamphetamine (ADDERALL) 30 MG tablet Take 1 tablet by mouth 2 (two) times daily. 60 tablet 0   [START ON 11/16/2022] amphetamine-dextroamphetamine (ADDERALL) 30 MG tablet Take 1 tablet by mouth 2 (two) times daily. 60 tablet 0   ARIPiprazole (ABILIFY) 5 MG tablet Take 1 tablet (5 mg total) by mouth daily. 90 tablet 3   atorvastatin (LIPITOR) 10 MG tablet TAKE 1 TABLET BY MOUTH ONCE DAILY     cloNIDine (CATAPRES) 0.1 MG tablet Take 1 tablet (0.1 mg total) by mouth at bedtime. 90 tablet 3   dicyclomine (BENTYL) 20 MG tablet Take 1 tablet (20 mg total) by mouth 3 (three) times daily as needed (abdominal pain). 30 tablet 0   escitalopram (LEXAPRO) 20 MG tablet TAKE 1 TABLET BY MOUTH  EVERY DAY 90 tablet 3   gabapentin (NEURONTIN) 300 MG capsule Take 1 capsule (300 mg total) by mouth at bedtime. 90 capsule 3   No current facility-administered medications for this visit.    Medication Side Effects: None  Allergies: No Known Allergies  Past Medical History:  Diagnosis Date   ADHD    Hyperlipidemia    PTSD  (post-traumatic stress disorder)     No family history on file.  Social History   Socioeconomic History   Marital status: Single    Spouse name: Not on file   Number of children: Not on file   Years of education: Not on file   Highest education level: Not on file  Occupational History   Not on file  Tobacco Use   Smoking status: Never   Smokeless tobacco: Never  Vaping Use   Vaping Use: Never used  Substance and Sexual Activity   Alcohol use: Not on file   Drug use: Not on file   Sexual activity: Not on file  Other Topics Concern   Not on file  Social History Narrative   Not on file   Social Determinants of Health   Financial Resource Strain: Not on file  Food Insecurity: Not on file  Transportation Needs: Not on file  Physical Activity: Not on file  Stress: Not on file  Social Connections: Not on file  Intimate Partner Violence: Not on file    Past Medical History, Surgical history, Social history, and Family history were reviewed and updated as appropriate.   Please see review of systems for further details on the patient's review from today.   Objective:   Physical Exam:  There were no vitals taken for this visit.  Physical Exam Constitutional:      General: He is not in acute distress. Musculoskeletal:        General: No deformity.  Neurological:     Mental Status: He is alert and oriented to person, place, and time.     Coordination: Coordination normal.  Psychiatric:        Attention and Perception: Attention and perception normal. He does not perceive auditory or visual hallucinations.        Mood and Affect: Mood normal. Mood is not anxious or depressed. Affect is not labile, blunt, angry or inappropriate.        Speech: Speech normal.        Behavior: Behavior normal.        Thought Content: Thought content normal. Thought content is not paranoid or delusional. Thought content does not include homicidal or suicidal ideation. Thought content does  not include homicidal or suicidal plan.        Cognition and Memory: Cognition and memory normal.        Judgment: Judgment normal.     Comments: Insight intact     Lab Review:     Component Value Date/Time   NA 139 06/09/2018 1715   K 4.0 06/09/2018 1715   CL 107 06/09/2018 1715   CO2 26 06/09/2018 1715   GLUCOSE 116 (H) 06/09/2018 1715   BUN 15 06/09/2018 1715   CREATININE 1.41 (H) 06/09/2018 1715   CALCIUM 8.6 (L) 06/09/2018 1715   PROT 7.3 06/09/2018 1715   ALBUMIN 3.5 06/09/2018 1715   AST 32 06/09/2018 1715   ALT 40 06/09/2018 1715   ALKPHOS 50 06/09/2018 1715   BILITOT 0.6 06/09/2018 1715   GFRNONAA >60 06/09/2018 1715   GFRAA >60 06/09/2018 1715  Component Value Date/Time   WBC 12.3 (H) 06/09/2018 1715   RBC 4.92 06/09/2018 1715   HGB 13.6 06/09/2018 1715   HCT 41.0 06/09/2018 1715   PLT 439 06/09/2018 1715   MCV 83.4 06/09/2018 1715   MCH 27.7 06/09/2018 1715   MCHC 33.2 06/09/2018 1715   RDW 14.4 06/09/2018 1715    No results found for: "POCLITH", "LITHIUM"   No results found for: "PHENYTOIN", "PHENOBARB", "VALPROATE", "CBMZ"   .res Assessment: Plan:    Plan:  Abilify 5mg  daily  Lexapro 20mg  daily Gabapentin 300mg  at bedtime   Adderall 30mg  BID Clonidine 0.1mg  a bedtime  Add Adderall 20mg  in the afternoons  Monitor BP between visits while taking stimulant medication.  Continue therapy  RTC 3 months  Discussed potential metabolic side effects associated with atypical antipsychotics, as well as potential risk for movement side effects. Advised pt to contact office if movement side effects occur.   Discussed potential benefits, risks, and side effects of stimulants with patient to include increased heart rate, palpitations, insomnia, increased anxiety, increased irritability, or decreased appetite.  Instructed patient to contact office if experiencing any significant tolerability issues.  Diagnoses and all orders for this  visit:  Insomnia, unspecified type -     gabapentin (NEURONTIN) 300 MG capsule; Take 1 capsule (300 mg total) by mouth at bedtime.  Major depressive disorder, recurrent episode, moderate (HCC) -     escitalopram (LEXAPRO) 20 MG tablet; TAKE 1 TABLET BY MOUTH EVERY DAY  Generalized anxiety disorder -     escitalopram (LEXAPRO) 20 MG tablet; TAKE 1 TABLET BY MOUTH EVERY DAY  Bipolar I disorder (HCC) -     cloNIDine (CATAPRES) 0.1 MG tablet; Take 1 tablet (0.1 mg total) by mouth at bedtime. -     ARIPiprazole (ABILIFY) 5 MG tablet; Take 1 tablet (5 mg total) by mouth daily.  Attention deficit hyperactivity disorder (ADHD), combined type -     amphetamine-dextroamphetamine (ADDERALL) 30 MG tablet; Take 1 tablet by mouth 2 (two) times daily. -     amphetamine-dextroamphetamine (ADDERALL) 30 MG tablet; Take 1 tablet by mouth 2 (two) times daily. -     amphetamine-dextroamphetamine (ADDERALL) 30 MG tablet; Take 1 tablet by mouth 2 (two) times daily. -     amphetamine-dextroamphetamine (ADDERALL) 20 MG tablet; Take 1/2 to one tablet in the afternoon.    Please see After Visit Summary for patient specific instructions.  No future appointments.   No orders of the defined types were placed in this encounter.     -------------------------------

## 2022-10-18 ENCOUNTER — Telehealth: Payer: Self-pay | Admitting: Adult Health

## 2022-10-18 ENCOUNTER — Other Ambulatory Visit: Payer: Self-pay

## 2022-10-18 DIAGNOSIS — F902 Attention-deficit hyperactivity disorder, combined type: Secondary | ICD-10-CM

## 2022-10-18 MED ORDER — AMPHETAMINE-DEXTROAMPHETAMINE 20 MG PO TABS: ORAL_TABLET | ORAL | 0 refills | Status: DC

## 2022-10-18 NOTE — Telephone Encounter (Signed)
Pended.

## 2022-10-18 NOTE — Telephone Encounter (Signed)
Pt LVM at 9:51a.  He wants the Adderall 20mg  sent to   [id number] [place], [place] - [address], [place] [address] Phone: [phone]  Fax: [phone]   (Currently the one showing for tomorrow is 30mg .  He says he takes 20mg  and 30mg )  Next appt 4/23

## 2022-11-26 ENCOUNTER — Other Ambulatory Visit: Payer: Self-pay | Admitting: Adult Health

## 2022-11-26 ENCOUNTER — Telehealth: Payer: Self-pay | Admitting: Adult Health

## 2022-11-26 DIAGNOSIS — F902 Attention-deficit hyperactivity disorder, combined type: Secondary | ICD-10-CM

## 2022-11-26 MED ORDER — AMPHETAMINE-DEXTROAMPHETAMINE 20 MG PO TABS: ORAL_TABLET | ORAL | 0 refills | Status: DC

## 2022-11-26 NOTE — Telephone Encounter (Signed)
Pt called at 3:33p requesting refill of Adderall 20mg  to  [id number] - [place], [place] - [address], [place] [address] Phone: [phone]  Fax: [phone]   Next appt 4/3

## 2022-11-26 NOTE — Telephone Encounter (Signed)
Script sent  

## 2022-12-26 ENCOUNTER — Telehealth: Payer: Self-pay | Admitting: Adult Health

## 2022-12-26 ENCOUNTER — Other Ambulatory Visit: Payer: Self-pay | Admitting: Adult Health

## 2022-12-26 DIAGNOSIS — F902 Attention-deficit hyperactivity disorder, combined type: Secondary | ICD-10-CM

## 2022-12-26 MED ORDER — AMPHETAMINE-DEXTROAMPHETAMINE 30 MG PO TABS: 30.0000 mg | ORAL_TABLET | Freq: Two times a day (BID) | ORAL | 0 refills | Status: DC

## 2022-12-26 MED ORDER — AMPHETAMINE-DEXTROAMPHETAMINE 20 MG PO TABS: ORAL_TABLET | ORAL | 0 refills | Status: DC

## 2022-12-26 NOTE — Telephone Encounter (Signed)
Script sent  

## 2022-12-26 NOTE — Progress Notes (Signed)
Script sent  

## 2022-12-26 NOTE — Telephone Encounter (Signed)
Patient lvm requesting refills on the Adderall 30 mg and 20 mg. Fill at the [id number], [place] - [address], [place] [address] Phone: [phone]  Fax: [phone]   Appointment 01/01/23

## 2023-01-01 ENCOUNTER — Encounter: Payer: Self-pay | Admitting: Adult Health

## 2023-01-01 ENCOUNTER — Ambulatory Visit: Payer: BC Managed Care – PPO | Admitting: Adult Health

## 2023-01-01 ENCOUNTER — Ambulatory Visit (INDEPENDENT_AMBULATORY_CARE_PROVIDER_SITE_OTHER): Payer: BC Managed Care – PPO | Admitting: Adult Health

## 2023-01-01 DIAGNOSIS — Z0389 Encounter for observation for other suspected diseases and conditions ruled out: Secondary | ICD-10-CM

## 2023-01-01 DIAGNOSIS — F319 Bipolar disorder, unspecified: Secondary | ICD-10-CM

## 2023-01-01 DIAGNOSIS — F4310 Post-traumatic stress disorder, unspecified: Secondary | ICD-10-CM

## 2023-01-01 DIAGNOSIS — G4700 Insomnia, unspecified: Secondary | ICD-10-CM | POA: Diagnosis not present

## 2023-01-01 DIAGNOSIS — F902 Attention-deficit hyperactivity disorder, combined type: Secondary | ICD-10-CM

## 2023-01-01 DIAGNOSIS — F411 Generalized anxiety disorder: Secondary | ICD-10-CM | POA: Diagnosis not present

## 2023-01-01 MED ORDER — CLONIDINE HCL 0.2 MG PO TABS: 0.2000 mg | ORAL_TABLET | Freq: Every day | ORAL | 2 refills | Status: DC

## 2023-01-01 NOTE — Progress Notes (Signed)
Patient N/S appointment. Called 3 times - left 2 VM to return call.

## 2023-01-01 NOTE — Progress Notes (Signed)
[name] [id number] 05-20-1986 37 y.o.  Virtual Visit via Telephone Note  I connected with pt on 01/01/23 at  2:00 PM EDT by telephone and verified that I am speaking with the correct person using two identifiers.   I discussed the limitations, risks, security and privacy concerns of performing an evaluation and management service by telephone and the availability of in person appointments. I also discussed with the patient that there may be a patient responsible charge related to this service. The patient expressed understanding and agreed to proceed.   I discussed the assessment and treatment plan with the patient. The patient was provided an opportunity to ask questions and all were answered. The patient agreed with the plan and demonstrated an understanding of the instructions.   The patient was advised to call back or seek an in-person evaluation if the symptoms worsen or if the condition fails to improve as anticipated.  I provided 25 minutes of non-face-to-face time during this encounter.  The patient was located at home.  The provider was located at [facility].   Norman Gibbs, NP   Subjective:   Patient ID:  Norman Abbott is a 37 y.o. (DOB [birth year]) male.  Chief Complaint: No chief complaint on file.   HPI Norman Abbott presents for follow-up of PTSD, depression, anxiety, insomnia, bipolar disorder, and ADHD.  Describes mood today as "ok". Pleasant. Denies tearfulness. Mood symptoms - reports depression, anxiety, and irritability. Reports outbursts. Mood - "not the best". Stating "there is a lot going on around me right now". Reports increased situational stressors. Reports taking medications as prescribed. Family doing well. Stable interest and motivation. Taking medications as prescribed.  Energy levels stable. Active, does not have a regular exercise routine. Enjoys some usual interests and activities. Married. Lives with wife 2 children and emotional  support dog "bluie". Brother local.  Appetite decreased. Weight loss - 180 pounds. Sleeps well most nights. Averages 4 hours. Focus and concentration stable with Adderall. Completing tasks. Managing aspects of household. 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  Denies SI or HI.  Denies AH or VH. Denies self harm. Denies substance use.  Review of Systems:  Review of Systems  Musculoskeletal:  Negative for gait problem.  Neurological:  Negative for tremors.  Psychiatric/Behavioral:         Please refer to HPI    Medications: I have reviewed the patient's current medications.  Current Outpatient Medications  Medication Sig Dispense Refill   amphetamine-dextroamphetamine (ADDERALL) 30 MG tablet Take 1 tablet by mouth 2 (two) times daily. 60 tablet 0   amphetamine-dextroamphetamine (ADDERALL) 30 MG tablet Take 1 tablet by mouth 2 (two) times daily. 60 tablet 0   ARIPiprazole (ABILIFY) 5 MG tablet Take 1 tablet (5 mg total) by mouth daily. 90 tablet 3   atorvastatin (LIPITOR) 10 MG tablet TAKE 1 TABLET BY MOUTH ONCE DAILY     cloNIDine (CATAPRES) 0.2 MG tablet Take 1 tablet (0.2 mg total) by mouth at bedtime. 30 tablet 2   dicyclomine (BENTYL) 20 MG tablet Take 1 tablet (20 mg total) by mouth 3 (three) times daily as needed (abdominal pain). 30 tablet 0   escitalopram (LEXAPRO) 20 MG tablet TAKE 1 TABLET BY MOUTH EVERY DAY 90 tablet 3   gabapentin (NEURONTIN) 300 MG capsule Take 1 capsule (300 mg total) by mouth at bedtime. 90 capsule 3   No current facility-administered medications for this visit.    Medication Side Effects: None  Allergies: No Known Allergies  Past Medical History:  Diagnosis Date   ADHD    Hyperlipidemia    PTSD (post-traumatic stress disorder)     No family history on file.  Social History   Socioeconomic History   Marital status: Married    Spouse name: Not on file   Number of children: Not on file   Years of education: Not on file   Highest  education level: Not on file  Occupational History   Not on file  Tobacco Use   Smoking status: Never   Smokeless tobacco: Never  Vaping Use   Vaping Use: Never used  Substance and Sexual Activity   Alcohol use: Not on file   Drug use: Not on file   Sexual activity: Not on file  Other Topics Concern   Not on file  Social History Narrative   Not on file   Social Determinants of Health   Financial Resource Strain: Not on file  Food Insecurity: Not on file  Transportation Needs: Not on file  Physical Activity: Not on file  Stress: Not on file  Social Connections: Not on file  Intimate Partner Violence: Not on file    Past Medical History, Surgical history, Social history, and Family history were reviewed and updated as appropriate.   Please see review of systems for further details on the patient's review from today.   Objective:   Physical Exam:  There were no vitals taken for this visit.  Physical Exam Constitutional:      General: He is not in acute distress. Musculoskeletal:        General: No deformity.  Neurological:     Mental Status: He is alert and oriented to person, place, and time.     Coordination: Coordination normal.  Psychiatric:        Attention and Perception: Attention and perception normal. He does not perceive auditory or visual hallucinations.        Mood and Affect: Mood normal. Mood is not anxious or depressed. Affect is not labile, blunt, angry or inappropriate.        Speech: Speech normal.        Behavior: Behavior normal.        Thought Content: Thought content normal. Thought content is not paranoid or delusional. Thought content does not include homicidal or suicidal ideation. Thought content does not include homicidal or suicidal plan.        Cognition and Memory: Cognition and memory normal.        Judgment: Judgment normal.     Comments: Insight intact     Lab Review:     Component Value Date/Time   NA 139 06/09/2018 1715   K  4.0 06/09/2018 1715   CL 107 06/09/2018 1715   CO2 26 06/09/2018 1715   GLUCOSE 116 (H) 06/09/2018 1715   BUN 15 06/09/2018 1715   CREATININE 1.41 (H) 06/09/2018 1715   CALCIUM 8.6 (L) 06/09/2018 1715   PROT 7.3 06/09/2018 1715   ALBUMIN 3.5 06/09/2018 1715   AST 32 06/09/2018 1715   ALT 40 06/09/2018 1715   ALKPHOS 50 06/09/2018 1715   BILITOT 0.6 06/09/2018 1715   GFRNONAA >60 06/09/2018 1715   GFRAA >60 06/09/2018 1715       Component Value Date/Time   WBC 12.3 (H) 06/09/2018 1715   RBC 4.92 06/09/2018 1715   HGB 13.6 06/09/2018 1715   HCT 41.0 06/09/2018 1715   PLT 439 06/09/2018 1715   MCV  83.4 06/09/2018 1715   MCH 27.7 06/09/2018 1715   MCHC 33.2 06/09/2018 1715   RDW 14.4 06/09/2018 1715    No results found for: "POCLITH", "LITHIUM"   No results found for: "PHENYTOIN", "PHENOBARB", "VALPROATE", "CBMZ"   .res Assessment: Plan:    Plan:  Abilify 5mg  daily  Lexapro 20mg  daily Gabapentin 300mg  at bedtime    Increase Clonidine 0.1mg  to 0.2mg  at bedtime  Hold Adderall 30mg  BID - leave off until seen by PCP  D/C Adderall 20mg  in the afternoons  BP 160/107/93  Monitor BP between visits while taking stimulant medication.  Continue therapy  RTC 3 months  Discussed potential metabolic side effects associated with atypical antipsychotics, as well as potential risk for movement side effects. Advised pt to contact office if movement side effects occur.   Discussed potential benefits, risks, and side effects of stimulants with patient to include increased heart rate, palpitations, insomnia, increased anxiety, increased irritability, or decreased appetite.  Instructed patient to contact office if experiencing any significant tolerability issues.  Diagnoses and all orders for this visit:  Bipolar I disorder -     cloNIDine (CATAPRES) 0.2 MG tablet; Take 1 tablet (0.2 mg total) by mouth at bedtime.  Attention deficit hyperactivity disorder (ADHD), combined  type  Insomnia, unspecified type  Generalized anxiety disorder  PTSD (post-traumatic stress disorder)    Please see After Visit Summary for patient specific instructions.  No future appointments.   No orders of the defined types were placed in this encounter.     -------------------------------

## 2023-01-08 ENCOUNTER — Other Ambulatory Visit: Payer: Self-pay | Admitting: Adult Health

## 2023-01-08 DIAGNOSIS — F319 Bipolar disorder, unspecified: Secondary | ICD-10-CM

## 2023-01-24 ENCOUNTER — Telehealth: Payer: Self-pay | Admitting: Adult Health

## 2023-01-24 NOTE — Telephone Encounter (Signed)
I left the 20mg  off because he wasn't sleeping but 4 hours a night - we also discussed BP issues and I asked that he make sure and check with PCP about that.

## 2023-01-24 NOTE — Telephone Encounter (Signed)
Called patient to let him know that the 20 mg had been discontinued at last visit and he was to hold 30 mg dose until seen by PCP. He went to [facility] on [facility]23 and his BP there was 128/88. He said he was told he has white coat hypertension, though I see from his records that he has a h/o hypertension.   He is not due for a RF yet on the 20 mg, but wants "an order put in."

## 2023-01-24 NOTE — Telephone Encounter (Signed)
We discontinued his Adderall 20mg  daily.

## 2023-01-24 NOTE — Telephone Encounter (Signed)
Next appt is 01/28/23. Needs refill on Adderall 20 mg called to:  [id number] - [place], [place] - [address]   Phone: [phone]  Fax: [phone]    Per Gibril it is in stock.

## 2023-01-25 ENCOUNTER — Other Ambulatory Visit: Payer: Self-pay

## 2023-01-25 DIAGNOSIS — F902 Attention-deficit hyperactivity disorder, combined type: Secondary | ICD-10-CM

## 2023-01-25 MED ORDER — AMPHETAMINE-DEXTROAMPHETAMINE 30 MG PO TABS
30.0000 mg | ORAL_TABLET | Freq: Two times a day (BID) | ORAL | 0 refills | Status: DC
Start: 2023-01-25 — End: 2023-01-29

## 2023-01-25 NOTE — Telephone Encounter (Signed)
Told patient you would not fill the 20 mg, but he is 30 mg BID and requests a refill.

## 2023-01-25 NOTE — Telephone Encounter (Signed)
Pended 30 mg.  

## 2023-01-28 ENCOUNTER — Ambulatory Visit (INDEPENDENT_AMBULATORY_CARE_PROVIDER_SITE_OTHER): Payer: BC Managed Care – PPO | Admitting: Adult Health

## 2023-01-28 DIAGNOSIS — Z0389 Encounter for observation for other suspected diseases and conditions ruled out: Secondary | ICD-10-CM

## 2023-01-28 NOTE — Progress Notes (Signed)
Patient no show appointment. ? ?

## 2023-01-29 ENCOUNTER — Encounter: Payer: Self-pay | Admitting: Adult Health

## 2023-01-29 ENCOUNTER — Ambulatory Visit (INDEPENDENT_AMBULATORY_CARE_PROVIDER_SITE_OTHER): Payer: BC Managed Care – PPO | Admitting: Adult Health

## 2023-01-29 DIAGNOSIS — F4310 Post-traumatic stress disorder, unspecified: Secondary | ICD-10-CM

## 2023-01-29 DIAGNOSIS — Z0389 Encounter for observation for other suspected diseases and conditions ruled out: Secondary | ICD-10-CM

## 2023-01-29 DIAGNOSIS — F902 Attention-deficit hyperactivity disorder, combined type: Secondary | ICD-10-CM

## 2023-01-29 DIAGNOSIS — F319 Bipolar disorder, unspecified: Secondary | ICD-10-CM

## 2023-01-29 DIAGNOSIS — F411 Generalized anxiety disorder: Secondary | ICD-10-CM

## 2023-01-29 MED ORDER — AMPHETAMINE-DEXTROAMPHETAMINE 30 MG PO TABS: 30.0000 mg | ORAL_TABLET | Freq: Two times a day (BID) | ORAL | 0 refills | Status: DC

## 2023-01-29 NOTE — Progress Notes (Signed)
Norman Abbott [id number] 08-08-1986 37 y.o.  Subjective:   Patient ID:  Norman Abbott is a 37 y.o. (DOB 04-Oct-1985) male.  Chief Complaint: No chief complaint on file.   HPI Norman Abbott presents to the office today for follow-up of PTSD, depression, anxiety, insomnia, bipolar disorder, and ADHD.  Describes mood today as "ok". Pleasant. Denies tearfulness. Mood symptoms - denies depression, anxiety, and irritability. Denies worry, rumination, and over thinking. Denies recent outbursts. Mood has improved. Stating "I'm feeling better". Reports decreased situational stressors. Reports taking medications as prescribed. Family doing well. He and wife involved in marriage counseling. Stable interest and motivation. Taking medications as prescribed.  Energy levels stable. Active, does not have a regular exercise routine. Enjoys some usual interests and activities. Married. Lives with wife 2 children and emotional support dog "bluie". Brother local.  Appetite decreased. Weight gain - 180 to 195 pounds. Sleeps well most nights. Averages 8 or more hours. Focus and concentration stable with Adderall. Completing tasks. Managing aspects of household. 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  Denies SI or HI.  Denies AH or VH. Denies self harm. Denies substance use.  Review of Systems:  Review of Systems  Musculoskeletal:  Negative for gait problem.  Neurological:  Negative for tremors.  Psychiatric/Behavioral:         Please refer to HPI    Medications: I have reviewed the patient's current medications.  Current Outpatient Medications  Medication Sig Dispense Refill   amphetamine-dextroamphetamine (ADDERALL) 30 MG tablet Take 1 tablet by mouth 2 (two) times daily. 60 tablet 0   amphetamine-dextroamphetamine (ADDERALL) 30 MG tablet Take 1 tablet by mouth 2 (two) times daily. 60 tablet 0   ARIPiprazole (ABILIFY) 5 MG tablet TAKE 1 TABLET (5 MG TOTAL) BY MOUTH DAILY. 90 tablet 0    atorvastatin (LIPITOR) 10 MG tablet TAKE 1 TABLET BY MOUTH ONCE DAILY     cloNIDine (CATAPRES) 0.2 MG tablet Take 1 tablet (0.2 mg total) by mouth at bedtime. 30 tablet 2   dicyclomine (BENTYL) 20 MG tablet Take 1 tablet (20 mg total) by mouth 3 (three) times daily as needed (abdominal pain). 30 tablet 0   escitalopram (LEXAPRO) 20 MG tablet TAKE 1 TABLET BY MOUTH EVERY DAY 90 tablet 3   gabapentin (NEURONTIN) 300 MG capsule Take 1 capsule (300 mg total) by mouth at bedtime. 90 capsule 3   No current facility-administered medications for this visit.    Medication Side Effects: None  Allergies: No Known Allergies  Past Medical History:  Diagnosis Date   ADHD    Hyperlipidemia    PTSD (post-traumatic stress disorder)     Past Medical History, Surgical history, Social history, and Family history were reviewed and updated as appropriate.   Please see review of systems for further details on the patient's review from today.   Objective:   Physical Exam:  There were no vitals taken for this visit.  Physical Exam Constitutional:      General: He is not in acute distress. Musculoskeletal:        General: No deformity.  Neurological:     Mental Status: He is alert and oriented to person, place, and time.     Coordination: Coordination normal.  Psychiatric:        Attention and Perception: Attention and perception normal. He does not perceive auditory or visual hallucinations.        Mood and Affect: Mood normal. Mood is not anxious or  depressed. Affect is not labile, blunt, angry or inappropriate.        Speech: Speech normal.        Behavior: Behavior normal.        Thought Content: Thought content normal. Thought content is not paranoid or delusional. Thought content does not include homicidal or suicidal ideation. Thought content does not include homicidal or suicidal plan.        Cognition and Memory: Cognition and memory normal.        Judgment: Judgment normal.     Comments:  Insight intact     Lab Review:     Component Value Date/Time   NA 139 06/09/2018 1715   K 4.0 06/09/2018 1715   CL 107 06/09/2018 1715   CO2 26 06/09/2018 1715   GLUCOSE 116 (H) 06/09/2018 1715   BUN 15 06/09/2018 1715   CREATININE 1.41 (H) 06/09/2018 1715   CALCIUM 8.6 (L) 06/09/2018 1715   PROT 7.3 06/09/2018 1715   ALBUMIN 3.5 06/09/2018 1715   AST 32 06/09/2018 1715   ALT 40 06/09/2018 1715   ALKPHOS 50 06/09/2018 1715   BILITOT 0.6 06/09/2018 1715   GFRNONAA >60 06/09/2018 1715   GFRAA >60 06/09/2018 1715       Component Value Date/Time   WBC 12.3 (H) 06/09/2018 1715   RBC 4.92 06/09/2018 1715   HGB 13.6 06/09/2018 1715   HCT 41.0 06/09/2018 1715   PLT 439 06/09/2018 1715   MCV 83.4 06/09/2018 1715   MCH 27.7 06/09/2018 1715   MCHC 33.2 06/09/2018 1715   RDW 14.4 06/09/2018 1715    No results found for: "POCLITH", "LITHIUM"   No results found for: "PHENYTOIN", "PHENOBARB", "VALPROATE", "CBMZ"   .res Assessment: Plan:    Plan:  Abilify 5mg  daily  Lexapro 20mg  daily Gabapentin 300mg  at bedtime   Clonidine 0.2mg  at bedtime Adderall 30mg  BID   D/C Adderall 20mg  in the afternoons  BP 120/88/89 at urgent care on 01/01/2023 - "white coat syndrome".  Monitor BP between visits while taking stimulant medication.  Continue therapy  RTC 3 months  Discussed potential metabolic side effects associated with atypical antipsychotics, as well as potential risk for movement side effects. Advised pt to contact office if movement side effects occur.   Discussed potential benefits, risks, and side effects of stimulants with patient to include increased heart rate, palpitations, insomnia, increased anxiety, increased irritability, or decreased appetite.  Instructed patient to contact office if experiencing any significant tolerability issues.  There are no diagnoses linked to this encounter.   Please see After Visit Summary for patient specific  instructions.  Future Appointments  Date Time Provider Department Center  01/29/2023  8:00 AM Mandy Peeks, Thereasa Solo, NP [facility] None    No orders of the defined types were placed in this encounter.   -------------------------------

## 2023-03-15 ENCOUNTER — Other Ambulatory Visit: Payer: Self-pay | Admitting: Adult Health

## 2023-03-15 DIAGNOSIS — F319 Bipolar disorder, unspecified: Secondary | ICD-10-CM

## 2023-04-15 ENCOUNTER — Other Ambulatory Visit: Payer: Self-pay | Admitting: Adult Health

## 2023-04-15 DIAGNOSIS — F319 Bipolar disorder, unspecified: Secondary | ICD-10-CM

## 2023-04-24 ENCOUNTER — Telehealth: Payer: Self-pay | Admitting: Adult Health

## 2023-04-24 NOTE — Telephone Encounter (Signed)
Pt called requesting patch for ADHD 9 mg. RTC @ [phone]. Apr 8/21

## 2023-04-24 NOTE — Telephone Encounter (Signed)
Patient wants Rx for ADHD patch 9 mg. I think XELSTRYM is the brand name. He said that he had discussed with his PCP and this was recommended to avoid the highs and lows of oral stimulants. Told him that it is not usually stocked in pharmacies, that a PA would be required, and that it was likely to be expensive. Daytrana and XELSTRYM are the only 2 patches I am aware of and Daytrana does not have a 9 mg dose. He last filled Adderall 8/7. He has FU 8/21. Should he just wait to discuss with you at that time?

## 2023-04-24 NOTE — Telephone Encounter (Signed)
Patient notified that this would be discussed at his appointment on 8/21 and he was okay with this.

## 2023-05-01 ENCOUNTER — Ambulatory Visit (INDEPENDENT_AMBULATORY_CARE_PROVIDER_SITE_OTHER): Payer: BC Managed Care – PPO | Admitting: Adult Health

## 2023-05-01 ENCOUNTER — Encounter: Payer: Self-pay | Admitting: Adult Health

## 2023-05-01 ENCOUNTER — Telehealth: Payer: Self-pay | Admitting: Adult Health

## 2023-05-01 DIAGNOSIS — F319 Bipolar disorder, unspecified: Secondary | ICD-10-CM | POA: Diagnosis not present

## 2023-05-01 DIAGNOSIS — F411 Generalized anxiety disorder: Secondary | ICD-10-CM | POA: Diagnosis not present

## 2023-05-01 DIAGNOSIS — F4310 Post-traumatic stress disorder, unspecified: Secondary | ICD-10-CM

## 2023-05-01 DIAGNOSIS — F902 Attention-deficit hyperactivity disorder, combined type: Secondary | ICD-10-CM

## 2023-05-01 DIAGNOSIS — G4700 Insomnia, unspecified: Secondary | ICD-10-CM

## 2023-05-01 MED ORDER — XELSTRYM 9 MG/9HR TD PTCH: 9.0000 mg | MEDICATED_PATCH | Freq: Every day | TRANSDERMAL | 0 refills | Status: DC

## 2023-05-01 MED ORDER — CLONIDINE HCL 0.2 MG PO TABS
0.2000 mg | ORAL_TABLET | Freq: Every day | ORAL | 1 refills | Status: DC
Start: 2023-05-01 — End: 2023-11-12

## 2023-05-01 MED ORDER — ARIPIPRAZOLE 5 MG PO TABS: 5.0000 mg | ORAL_TABLET | Freq: Every day | ORAL | 1 refills | Status: DC

## 2023-05-01 NOTE — Telephone Encounter (Signed)
Pharmacy requests PA on XELSTRYM patch 9mg /9 hr, see CMM

## 2023-05-01 NOTE — Telephone Encounter (Signed)
PA submitted with [employer], pending response

## 2023-05-01 NOTE — Progress Notes (Signed)
Norman Abbott [id number] 02-01-1986 37 y.o.  Subjective:   Patient ID:  Norman Abbott is a 37 y.o. (DOB 1985/09/22) male.  Chief Complaint: No chief complaint on file.   HPI Norman Abbott presents to the office today for follow-up of PTSD, depression, anxiety, insomnia, bipolar disorder, and ADHD.  Describes mood today as "ok". Pleasant. Denies tearfulness. Mood symptoms - denies depression, anxiety, and irritability. Denies panic attacks. Denies worry, rumination, and over thinking. Denies recent outbursts. Mood has improved. Stating "things are going much better". Reports taking medications as prescribed. Family doing well. Stable interest and motivation. Taking medications as prescribed.  Energy levels stable. Active, has a regular exercise routine - 5 days a week. Enjoys some usual interests and activities. Married. Lives with wife 2 children and emotional support dog "bluie". Brother local.  Appetite adequate. Weight stable - 189 pounds. Sleeps well most nights. Averages 7 to 8 hours. Focus and concentration stable with Adderall, but would like to try a patch versus a tablet. Completing tasks. Managing aspects of household. 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  Denies SI or HI.  Denies AH or VH. Denies self harm. Denies substance use.  Review of Systems:  Review of Systems  Musculoskeletal:  Negative for gait problem.  Neurological:  Negative for tremors.  Psychiatric/Behavioral:         Please refer to HPI    Medications: I have reviewed the patient's current medications.  Current Outpatient Medications  Medication Sig Dispense Refill   amphetamine-dextroamphetamine (ADDERALL) 30 MG tablet Take 1 tablet by mouth 2 (two) times daily. 60 tablet 0   amphetamine-dextroamphetamine (ADDERALL) 30 MG tablet Take 1 tablet by mouth 2 (two) times daily. 60 tablet 0   ARIPiprazole (ABILIFY) 5 MG tablet Take 1 tablet (5 mg total) by mouth daily. 90 tablet 1   atorvastatin  (LIPITOR) 10 MG tablet TAKE 1 TABLET BY MOUTH ONCE DAILY     cloNIDine (CATAPRES) 0.2 MG tablet Take 1 tablet (0.2 mg total) by mouth at bedtime. 90 tablet 1   dicyclomine (BENTYL) 20 MG tablet Take 1 tablet (20 mg total) by mouth 3 (three) times daily as needed (abdominal pain). 30 tablet 0   escitalopram (LEXAPRO) 20 MG tablet TAKE 1 TABLET BY MOUTH EVERY DAY 90 tablet 3   gabapentin (NEURONTIN) 300 MG capsule Take 1 capsule (300 mg total) by mouth at bedtime. 90 capsule 3   No current facility-administered medications for this visit.    Medication Side Effects: None  Allergies: No Known Allergies  Past Medical History:  Diagnosis Date   ADHD    Hyperlipidemia    PTSD (post-traumatic stress disorder)     Past Medical History, Surgical history, Social history, and Family history were reviewed and updated as appropriate.   Please see review of systems for further details on the patient's review from today.   Objective:   Physical Exam:  There were no vitals taken for this visit.  Physical Exam Constitutional:      General: He is not in acute distress. Musculoskeletal:        General: No deformity.  Neurological:     Mental Status: He is alert and oriented to person, place, and time.     Coordination: Coordination normal.  Psychiatric:        Attention and Perception: Attention and perception normal. He does not perceive auditory or visual hallucinations.        Mood and Affect: Mood normal.  Mood is not anxious or depressed. Affect is not labile, blunt, angry or inappropriate.        Speech: Speech normal.        Behavior: Behavior normal.        Thought Content: Thought content normal. Thought content is not paranoid or delusional. Thought content does not include homicidal or suicidal ideation. Thought content does not include homicidal or suicidal plan.        Cognition and Memory: Cognition and memory normal.        Judgment: Judgment normal.     Comments: Insight  intact     Lab Review:     Component Value Date/Time   NA 139 06/09/2018 1715   K 4.0 06/09/2018 1715   CL 107 06/09/2018 1715   CO2 26 06/09/2018 1715   GLUCOSE 116 (H) 06/09/2018 1715   BUN 15 06/09/2018 1715   CREATININE 1.41 (H) 06/09/2018 1715   CALCIUM 8.6 (L) 06/09/2018 1715   PROT 7.3 06/09/2018 1715   ALBUMIN 3.5 06/09/2018 1715   AST 32 06/09/2018 1715   ALT 40 06/09/2018 1715   ALKPHOS 50 06/09/2018 1715   BILITOT 0.6 06/09/2018 1715   GFRNONAA >60 06/09/2018 1715   GFRAA >60 06/09/2018 1715       Component Value Date/Time   WBC 12.3 (H) 06/09/2018 1715   RBC 4.92 06/09/2018 1715   HGB 13.6 06/09/2018 1715   HCT 41.0 06/09/2018 1715   PLT 439 06/09/2018 1715   MCV 83.4 06/09/2018 1715   MCH 27.7 06/09/2018 1715   MCHC 33.2 06/09/2018 1715   RDW 14.4 06/09/2018 1715    No results found for: "POCLITH", "LITHIUM"   No results found for: "PHENYTOIN", "PHENOBARB", "VALPROATE", "CBMZ"   .res Assessment: Plan:    Plan:  Abilify 5mg  daily  Lexapro 20mg  daily Gabapentin 300mg  at bedtime   Clonidine 0.2mg  at bedtime Adderall 30mg  BID   Monitor BP between visits while taking stimulant medication - per patient 128/87 this week - white coat syndrome.  Continue therapy  RTC 3 months  Discussed potential metabolic side effects associated with atypical antipsychotics, as well as potential risk for movement side effects. Advised pt to contact office if movement side effects occur.   Discussed potential benefits, risks, and side effects of stimulants with patient to include increased heart rate, palpitations, insomnia, increased anxiety, increased irritability, or decreased appetite.  Instructed patient to contact office if experiencing any significant tolerability issues.  Diagnoses and all orders for this visit:  Bipolar I disorder (HCC) -     ARIPiprazole (ABILIFY) 5 MG tablet; Take 1 tablet (5 mg total) by mouth daily. -     cloNIDine (CATAPRES) 0.2 MG  tablet; Take 1 tablet (0.2 mg total) by mouth at bedtime.     Please see After Visit Summary for patient specific instructions.  No future appointments.   No orders of the defined types were placed in this encounter.   -------------------------------

## 2023-05-02 NOTE — Telephone Encounter (Signed)
Prior Approval received with [employer] effective 05/01/2023-04/30/2024, PA# [id number]  Will contact pt of approval.

## 2023-05-03 ENCOUNTER — Other Ambulatory Visit: Payer: Self-pay

## 2023-05-03 DIAGNOSIS — F902 Attention-deficit hyperactivity disorder, combined type: Secondary | ICD-10-CM

## 2023-05-03 MED ORDER — XELSTRYM 9 MG/9HR TD PTCH
9.0000 mg | MEDICATED_PATCH | Freq: Every day | TRANSDERMAL | 0 refills | Status: DC
Start: 2023-05-03 — End: 2023-08-15

## 2023-05-03 NOTE — Telephone Encounter (Signed)
Left pt message to discuss the process of getting his [facility] Patch.  There are two pharmacies partnered with [employer] that can mail pt the patches at $0 cost since his PA is approved.   [address] [place], [place], [place] [phone] Pharmacy, [place], [place] [phone]  Rx pended for [facility].   Uploaded savings card for pt to be sent to his phone # and email. Printed off coupon as well if pt would like it Bin: [id number], PCN: [id number], GRP: [id number], ID# [id number]

## 2023-05-03 NOTE — Telephone Encounter (Signed)
Pt called back and he has already gotten the medication for $25.00. he has a friend that signed him up for the savings program so he's all set. Pt will call back to update how the patch is working.

## 2023-05-27 ENCOUNTER — Telehealth: Payer: Self-pay

## 2023-05-27 ENCOUNTER — Telehealth: Payer: Self-pay | Admitting: Adult Health

## 2023-05-27 NOTE — Telephone Encounter (Signed)
Pt called and said that he has been taking two 9 mg patches of the xelstrym to have it be effective. He is asking for a 18 mg [facility] [place].

## 2023-05-28 NOTE — Telephone Encounter (Signed)
Please call to schedule an earlier appt with [name] to discuss his stimulant dosing.

## 2023-05-28 NOTE — Telephone Encounter (Signed)
Spoke with patient relaying msg from provider- he needs to discuss increasing from 9mg  patch to 18mg  patch with provider. His next visit is 07/15/23. He would like to know if there is anything available today.

## 2023-05-29 NOTE — Telephone Encounter (Signed)
Pt coming in on friday

## 2023-05-29 NOTE — Telephone Encounter (Signed)
Patient has an appt on Friday. He can discuss with [name] at that time.

## 2023-05-29 NOTE — Telephone Encounter (Signed)
Alvie called at 9:13 and knows that Norman Abbott is wanting to hold off changing the dose for the patch.  So he wants to know if she will send in a prescription for Adderall 30mg  tablets.  His appt is still 11/4.  [id number] [place], [place] - [address]

## 2023-05-31 ENCOUNTER — Ambulatory Visit (INDEPENDENT_AMBULATORY_CARE_PROVIDER_SITE_OTHER): Payer: BC Managed Care – PPO | Admitting: Adult Health

## 2023-05-31 ENCOUNTER — Encounter: Payer: Self-pay | Admitting: Adult Health

## 2023-05-31 DIAGNOSIS — F902 Attention-deficit hyperactivity disorder, combined type: Secondary | ICD-10-CM | POA: Diagnosis not present

## 2023-05-31 MED ORDER — AMPHETAMINE-DEXTROAMPHETAMINE 30 MG PO TABS
30.0000 mg | ORAL_TABLET | Freq: Two times a day (BID) | ORAL | 0 refills | Status: DC
Start: 2023-05-31 — End: 2023-07-04

## 2023-05-31 MED ORDER — XELSTRYM 18 MG/9HR TD PTCH
18.0000 mg | MEDICATED_PATCH | Freq: Every morning | TRANSDERMAL | 0 refills | Status: DC
Start: 2023-05-31 — End: 2023-08-12

## 2023-05-31 MED ORDER — XELSTRYM 18 MG/9HR TD PTCH
18.0000 mg | MEDICATED_PATCH | Freq: Every morning | TRANSDERMAL | 0 refills | Status: DC
Start: 2023-06-28 — End: 2023-08-15

## 2023-05-31 NOTE — Progress Notes (Signed)
Norman Abbott [id number] 16-Aug-1986 37 y.o.  Subjective:   Patient ID:  Norman Abbott is a 37 y.o. (DOB 1986/01/01) male.  Chief Complaint: No chief complaint on file.   HPI Norman Abbott presents to the office today for follow-up of ADHD.  Describes mood today as "ok". Pleasant. Denies tearfulness. Mood symptoms - denies depression, anxiety, and irritability. Denies panic attacks. Denies worry, rumination, and over thinking. Denies recent outbursts. Mood is stable. Stating "I feel like I'm doing ok". Reports taking medications as prescribed. Family doing well. Stable interest and motivation. Taking medications as prescribed.  Energy levels stable. Active, has a regular exercise routine - 5 days a week. Enjoys some usual interests and activities. Married. Lives with wife 2 children and emotional support dog "bluie". Brother local.  Appetite adequate. Weight stable - 189 pounds. Sleeps well most nights. Averages 7 to 8 hours. Focus and concentration stable with Xelstrym patch. Completing tasks. Managing aspects of household. Self employed. Denies SI or HI.  Denies AH or VH. Denies self harm. Denies substance use.      Review of Systems:  Review of Systems  Musculoskeletal:  Negative for gait problem.  Neurological:  Negative for tremors.  Psychiatric/Behavioral:         Please refer to HPI    Medications: I have reviewed the patient's current medications.  Current Outpatient Medications  Medication Sig Dispense Refill   amphetamine-dextroamphetamine (ADDERALL) 30 MG tablet Take 1 tablet by mouth 2 (two) times daily. 60 tablet 0   amphetamine-dextroamphetamine (ADDERALL) 30 MG tablet Take 1 tablet by mouth 2 (two) times daily. 60 tablet 0   ARIPiprazole (ABILIFY) 5 MG tablet Take 1 tablet (5 mg total) by mouth daily. 90 tablet 1   atorvastatin (LIPITOR) 10 MG tablet TAKE 1 TABLET BY MOUTH ONCE DAILY     cloNIDine (CATAPRES) 0.2 MG tablet Take 1 tablet (0.2 mg total) by mouth  at bedtime. 90 tablet 1   Dextroamphetamine (XELSTRYM) 9 MG/9HR PTCH Place 9 mg onto the skin daily. 30 patch 0   dicyclomine (BENTYL) 20 MG tablet Take 1 tablet (20 mg total) by mouth 3 (three) times daily as needed (abdominal pain). 30 tablet 0   escitalopram (LEXAPRO) 20 MG tablet TAKE 1 TABLET BY MOUTH EVERY DAY 90 tablet 3   gabapentin (NEURONTIN) 300 MG capsule Take 1 capsule (300 mg total) by mouth at bedtime. 90 capsule 3   No current facility-administered medications for this visit.    Medication Side Effects: None  Allergies: No Known Allergies  Past Medical History:  Diagnosis Date   ADHD    Hyperlipidemia    PTSD (post-traumatic stress disorder)     Past Medical History, Surgical history, Social history, and Family history were reviewed and updated as appropriate.   Please see review of systems for further details on the patient's review from today.   Objective:   Physical Exam:  There were no vitals taken for this visit.  Physical Exam Constitutional:      General: He is not in acute distress. Musculoskeletal:        General: No deformity.  Neurological:     Mental Status: He is alert and oriented to person, place, and time.     Coordination: Coordination normal.  Psychiatric:        Attention and Perception: Attention and perception normal. He does not perceive auditory or visual hallucinations.        Mood and Affect: Affect is not labile,  blunt, angry or inappropriate.        Speech: Speech normal.        Behavior: Behavior normal.        Thought Content: Thought content normal. Thought content is not paranoid or delusional. Thought content does not include homicidal or suicidal ideation. Thought content does not include homicidal or suicidal plan.        Cognition and Memory: Cognition and memory normal.        Judgment: Judgment normal.     Comments: Insight intact     Lab Review:     Component Value Date/Time   NA 139 06/09/2018 1715   K 4.0  06/09/2018 1715   CL 107 06/09/2018 1715   CO2 26 06/09/2018 1715   GLUCOSE 116 (H) 06/09/2018 1715   BUN 15 06/09/2018 1715   CREATININE 1.41 (H) 06/09/2018 1715   CALCIUM 8.6 (L) 06/09/2018 1715   PROT 7.3 06/09/2018 1715   ALBUMIN 3.5 06/09/2018 1715   AST 32 06/09/2018 1715   ALT 40 06/09/2018 1715   ALKPHOS 50 06/09/2018 1715   BILITOT 0.6 06/09/2018 1715   GFRNONAA >60 06/09/2018 1715   GFRAA >60 06/09/2018 1715       Component Value Date/Time   WBC 12.3 (H) 06/09/2018 1715   RBC 4.92 06/09/2018 1715   HGB 13.6 06/09/2018 1715   HCT 41.0 06/09/2018 1715   PLT 439 06/09/2018 1715   MCV 83.4 06/09/2018 1715   MCH 27.7 06/09/2018 1715   MCHC 33.2 06/09/2018 1715   RDW 14.4 06/09/2018 1715    No results found for: "POCLITH", "LITHIUM"   No results found for: "PHENYTOIN", "PHENOBARB", "VALPROATE", "CBMZ"   .res Assessment: Plan:    Plan:  Abilify 5mg  daily  Lexapro 20mg  daily Gabapentin 300mg  at bedtime   Clonidine 0.2mg  at bedtime Xelstym 18mg  patch for ADD   Monitor BP between visits while taking stimulant medication - per patient 128/87 this week - white coat syndrome.  Continue therapy  RTC 2 months  Discussed potential metabolic side effects associated with atypical antipsychotics, as well as potential risk for movement side effects. Advised pt to contact office if movement side effects occur.   Discussed potential benefits, risks, and side effects of stimulants with patient to include increased heart rate, palpitations, insomnia, increased anxiety, increased irritability, or decreased appetite.  Instructed patient to contact office if experiencing any significant tolerability issues  There are no diagnoses linked to this encounter.   Please see After Visit Summary for patient specific instructions.  Future Appointments  Date Time Provider Department Center  05/31/2023 10:40 AM Lakara Weiland, Thereasa Solo, NP CP-CP None  07/15/2023  8:00 AM Eyden Dobie,  Thereasa Solo, NP CP-CP None    No orders of the defined types were placed in this encounter.   -------------------------------

## 2023-07-04 ENCOUNTER — Telehealth: Payer: Self-pay | Admitting: Adult Health

## 2023-07-04 ENCOUNTER — Other Ambulatory Visit: Payer: Self-pay

## 2023-07-04 DIAGNOSIS — F902 Attention-deficit hyperactivity disorder, combined type: Secondary | ICD-10-CM

## 2023-07-04 MED ORDER — AMPHETAMINE-DEXTROAMPHETAMINE 30 MG PO TABS
30.0000 mg | ORAL_TABLET | Freq: Two times a day (BID) | ORAL | 0 refills | Status: DC
Start: 2023-07-04 — End: 2023-07-15

## 2023-07-04 NOTE — Telephone Encounter (Signed)
Pharmacy said patch should be in tomorrow.

## 2023-07-04 NOTE — Telephone Encounter (Signed)
Pt called at 9:23a requesting another week's worth of Adderall 30mg  until his [facility] patch comes in.  Pls [facility]   [id number], [place] - [address], [place] [address] Phone: [phone]  Fax: [phone]   Next appt 11/4

## 2023-07-04 NOTE — Telephone Encounter (Signed)
Pended.

## 2023-07-15 ENCOUNTER — Ambulatory Visit (INDEPENDENT_AMBULATORY_CARE_PROVIDER_SITE_OTHER): Payer: Self-pay | Admitting: Adult Health

## 2023-07-15 DIAGNOSIS — Z0389 Encounter for observation for other suspected diseases and conditions ruled out: Secondary | ICD-10-CM

## 2023-07-15 NOTE — Progress Notes (Signed)
Patient no show appointment. ? ?

## 2023-08-12 ENCOUNTER — Telehealth: Payer: Self-pay | Admitting: Adult Health

## 2023-08-12 ENCOUNTER — Other Ambulatory Visit: Payer: Self-pay

## 2023-08-12 DIAGNOSIS — F902 Attention-deficit hyperactivity disorder, combined type: Secondary | ICD-10-CM

## 2023-08-12 MED ORDER — XELSTRYM 18 MG/9HR TD PTCH
18.0000 mg | MEDICATED_PATCH | Freq: Every morning | TRANSDERMAL | 0 refills | Status: DC
Start: 2023-08-12 — End: 2023-08-15

## 2023-08-12 MED ORDER — AMPHETAMINE-DEXTROAMPHETAMINE 30 MG PO TABS: 30.0000 mg | ORAL_TABLET | Freq: Every day | ORAL | 0 refills | Status: DC

## 2023-08-12 NOTE — Telephone Encounter (Signed)
Pt called @ 10:59a requesting refill of Xelstrym patch.  He said because the pharmacy has to order it, [name] will send in a week of Adderall 30mg  (2 times a day) while he waits for the patch to arrive. Send script to    [id number], [place] - [address], [place] [address] Phone: [phone]  Fax: [phone]   Next appt 12/5

## 2023-08-12 NOTE — Telephone Encounter (Signed)
Pended #30 patches and #3 tablets.

## 2023-08-15 ENCOUNTER — Telehealth: Payer: BC Managed Care – PPO | Admitting: Adult Health

## 2023-08-15 ENCOUNTER — Encounter: Payer: Self-pay | Admitting: Adult Health

## 2023-08-15 DIAGNOSIS — F902 Attention-deficit hyperactivity disorder, combined type: Secondary | ICD-10-CM | POA: Diagnosis not present

## 2023-08-15 MED ORDER — XELSTRYM 18 MG/9HR TD PTCH: 18.0000 mg | MEDICATED_PATCH | Freq: Every morning | TRANSDERMAL | 0 refills | Status: DC

## 2023-08-15 NOTE — Progress Notes (Signed)
Norman Abbott [id number] 08/20/1986 37 y.o.  Virtual Visit via Video Note  I connected with pt @ on 08/15/23 at 10:40 AM EST by a video enabled telemedicine application and verified that I am speaking with the correct person using two identifiers.   I discussed the limitations of evaluation and management by telemedicine and the availability of in person appointments. The patient expressed understanding and agreed to proceed.  I discussed the assessment and treatment plan with the patient. The patient was provided an opportunity to ask questions and all were answered. The patient agreed with the plan and demonstrated an understanding of the instructions.   The patient was advised to call back or seek an in-person evaluation if the symptoms worsen or if the condition fails to improve as anticipated.  I provided 10 minutes of non-face-to-face time during this encounter.  The patient was located at home.  The provider was located at [facility].   Dorothyann Gibbs, NP   Subjective:   Patient ID:  Norman Abbott is a 37 y.o. (DOB 11/24/1985) male.  Chief Complaint: No chief complaint on file.   HPI Norman Abbott presents for follow-up of ADHD.  Describes mood today as "ok". Pleasant. Denies tearfulness. Mood symptoms - denies depression, anxiety, and irritability. Denies panic attacks. Denies worry, rumination, and over thinking. Mood is stable. Stating "I feel like I'm doing pretty good". Reports taking medications as prescribed. Family doing well. Stable interest and motivation. Taking medications as prescribed.  Energy levels stable. Active, has a regular exercise routine - 5 days a week. Enjoys some usual interests and activities. Married. Lives with wife 2 children and emotional support dog "bluie". Brother local.  Appetite adequate. Weight stable - 189 pounds. Sleeps well most nights. Averages 7 to 8 hours. Focus and concentration stable with Xelstrym patch. Completing  tasks. Managing aspects of household. Self employed. Denies SI or HI.  Denies AH or VH. Denies self harm. Denies substance use.   Review of Systems:  Review of Systems  Musculoskeletal:  Negative for gait problem.  Neurological:  Negative for tremors.  Psychiatric/Behavioral:         Please refer to HPI    Medications: I have reviewed the patient's current medications.  Current Outpatient Medications  Medication Sig Dispense Refill   ARIPiprazole (ABILIFY) 5 MG tablet Take 1 tablet (5 mg total) by mouth daily. 90 tablet 1   atorvastatin (LIPITOR) 10 MG tablet TAKE 1 TABLET BY MOUTH ONCE DAILY     cloNIDine (CATAPRES) 0.2 MG tablet Take 1 tablet (0.2 mg total) by mouth at bedtime. 90 tablet 1   Dextroamphetamine (XELSTRYM) 18 MG/9HR PTCH Place 18 mg onto the skin every morning. 30 patch 0   [START ON 09/12/2023] Dextroamphetamine (XELSTRYM) 18 MG/9HR PTCH Place 18 mg onto the skin every morning. 30 patch 0   dicyclomine (BENTYL) 20 MG tablet Take 1 tablet (20 mg total) by mouth 3 (three) times daily as needed (abdominal pain). 30 tablet 0   escitalopram (LEXAPRO) 20 MG tablet TAKE 1 TABLET BY MOUTH EVERY DAY 90 tablet 3   gabapentin (NEURONTIN) 300 MG capsule Take 1 capsule (300 mg total) by mouth at bedtime. 90 capsule 3   No current facility-administered medications for this visit.    Medication Side Effects: None  Allergies: No Known Allergies  Past Medical History:  Diagnosis Date   ADHD    Hyperlipidemia    PTSD (post-traumatic stress disorder)     No family  history on file.  Social History   Socioeconomic History   Marital status: Married    Spouse name: Not on file   Number of children: Not on file   Years of education: Not on file   Highest education level: Not on file  Occupational History   Not on file  Tobacco Use   Smoking status: Never   Smokeless tobacco: Never  Vaping Use   Vaping status: Never Used  Substance and Sexual Activity   Alcohol use:  Not on file   Drug use: Not on file   Sexual activity: Not on file  Other Topics Concern   Not on file  Social History Narrative   Not on file   Social Determinants of Health   Financial Resource Strain: Not on file  Food Insecurity: Not on file  Transportation Needs: Not on file  Physical Activity: Not on file  Stress: Not on file  Social Connections: Not on file  Intimate Partner Violence: Not on file    Past Medical History, Surgical history, Social history, and Family history were reviewed and updated as appropriate.   Please see review of systems for further details on the patient's review from today.   Objective:   Physical Exam:  There were no vitals taken for this visit.  Physical Exam Constitutional:      General: He is not in acute distress. Musculoskeletal:        General: No deformity.  Neurological:     Mental Status: He is alert and oriented to person, place, and time.     Coordination: Coordination normal.  Psychiatric:        Attention and Perception: Attention and perception normal. He does not perceive auditory or visual hallucinations.        Mood and Affect: Affect is not labile, blunt, angry or inappropriate.        Speech: Speech normal.        Behavior: Behavior normal.        Thought Content: Thought content normal. Thought content is not paranoid or delusional. Thought content does not include homicidal or suicidal ideation. Thought content does not include homicidal or suicidal plan.        Cognition and Memory: Cognition and memory normal.        Judgment: Judgment normal.     Comments: Insight intact     Lab Review:     Component Value Date/Time   NA 139 06/09/2018 1715   K 4.0 06/09/2018 1715   CL 107 06/09/2018 1715   CO2 26 06/09/2018 1715   GLUCOSE 116 (H) 06/09/2018 1715   BUN 15 06/09/2018 1715   CREATININE 1.41 (H) 06/09/2018 1715   CALCIUM 8.6 (L) 06/09/2018 1715   PROT 7.3 06/09/2018 1715   ALBUMIN 3.5 06/09/2018 1715    AST 32 06/09/2018 1715   ALT 40 06/09/2018 1715   ALKPHOS 50 06/09/2018 1715   BILITOT 0.6 06/09/2018 1715   GFRNONAA >60 06/09/2018 1715   GFRAA >60 06/09/2018 1715       Component Value Date/Time   WBC 12.3 (H) 06/09/2018 1715   RBC 4.92 06/09/2018 1715   HGB 13.6 06/09/2018 1715   HCT 41.0 06/09/2018 1715   PLT 439 06/09/2018 1715   MCV 83.4 06/09/2018 1715   MCH 27.7 06/09/2018 1715   MCHC 33.2 06/09/2018 1715   RDW 14.4 06/09/2018 1715    No results found for: "POCLITH", "LITHIUM"   No results found for: "PHENYTOIN", "PHENOBARB", "VALPROATE", "  CBMZ"   .res Assessment: Plan:    Plan:  Abilify 5mg  daily  Lexapro 20mg  to 10mg  daily Gabapentin 300mg  at bedtime   Clonidine 0.2mg  at bedtime Xelstrym 18mg  patch for ADD   Monitor BP between visits while taking stimulant medication - per patient 128/87 this week - white coat syndrome.  Continue therapy  RTC 2 months  Discussed potential metabolic side effects associated with atypical antipsychotics, as well as potential risk for movement side effects. Advised pt to contact office if movement side effects occur.   Discussed potential benefits, risks, and side effects of stimulants with patient to include increased heart rate, palpitations, insomnia, increased anxiety, increased irritability, or decreased appetite.  Instructed patient to contact office if experiencing any significant tolerability issues Diagnoses and all orders for this visit:  Attention deficit hyperactivity disorder (ADHD), combined type -     Dextroamphetamine (XELSTRYM) 18 MG/9HR PTCH; Place 18 mg onto the skin every morning. -     Dextroamphetamine (XELSTRYM) 18 MG/9HR PTCH; Place 18 mg onto the skin every morning.     Please see After Visit Summary for patient specific instructions.  No future appointments.   No orders of the defined types were placed in this encounter.     -------------------------------

## 2023-09-02 ENCOUNTER — Telehealth: Payer: Self-pay | Admitting: Adult Health

## 2023-09-02 NOTE — Telephone Encounter (Signed)
Pt LVM  @ 8:32a stating the patch is not working.  He is requesting refill of Adderall 30mg  2 times a day to   [id number] - [place],  - [address], [place] [address] Phone: [phone]  Fax: [phone]   No upcoming appt scheduled.

## 2023-09-02 NOTE — Telephone Encounter (Signed)
Patient is reporting the patch is not working and asking to switch back to Adderall 30 mg BID. He last filled 12/2, due 30. If ok will pend when due.

## 2023-09-07 ENCOUNTER — Other Ambulatory Visit: Payer: Self-pay

## 2023-09-07 NOTE — Telephone Encounter (Signed)
Pended Adderall 30 mg BID to requested pharmacy with notation to cancel patch.

## 2023-09-09 ENCOUNTER — Telehealth: Payer: Self-pay

## 2023-09-09 MED ORDER — AMPHETAMINE-DEXTROAMPHETAMINE 30 MG PO TABS: 30.0000 mg | ORAL_TABLET | Freq: Two times a day (BID) | ORAL | 0 refills | Status: DC

## 2023-09-09 NOTE — Telephone Encounter (Signed)
Prior Authorization submitted and approved for Amphetamine-Dextroamphetamine 30 mg #60,  PA Case: [id number],  09/09/2023- 09/08/2024 Authorization Expiration Date: 09/07/2024

## 2023-10-16 ENCOUNTER — Other Ambulatory Visit: Payer: Self-pay | Admitting: Adult Health

## 2023-10-16 DIAGNOSIS — F319 Bipolar disorder, unspecified: Secondary | ICD-10-CM

## 2023-10-16 NOTE — Telephone Encounter (Signed)
 DOSE CHNGE 12/5 0.2MG  at bedtime

## 2023-11-04 ENCOUNTER — Telehealth: Payer: Self-pay | Admitting: Adult Health

## 2023-11-04 NOTE — Telephone Encounter (Signed)
 Patient is asking for Adderall 30 mg once a day AND Xelstrym patch. I don't see that he has been on both doses.  For a short time he was getting a few Adderall to allow time for patch to get in, but that was discontinued. In Dec he was on Adderall 30 mg #60. Please advise and I will pend as appropriate.

## 2023-11-04 NOTE — Telephone Encounter (Signed)
 I saw where he said the patch didn't work, but got patch last month.

## 2023-11-04 NOTE — Telephone Encounter (Signed)
 Pt called and said that he wants to have a refill on his adderall 30 mg 1 tablet a day. He also wants to go back on  the xelstrym 18 mg patch. [facility] [place]. Please call him and let him know gina agrees with this . He does have an appointment  on 11/12/23

## 2023-11-05 ENCOUNTER — Other Ambulatory Visit: Payer: Self-pay

## 2023-11-05 MED ORDER — AMPHETAMINE-DEXTROAMPHETAMINE 30 MG PO TABS: 30.0000 mg | ORAL_TABLET | Freq: Two times a day (BID) | ORAL | 0 refills | Status: DC

## 2023-11-05 NOTE — Telephone Encounter (Signed)
 Pended [facility] IR [facility] [place].

## 2023-11-05 NOTE — Telephone Encounter (Signed)
 See message. He last filled patch 1/27, so he should still have one day left.

## 2023-11-05 NOTE — Telephone Encounter (Signed)
 Pt lvm at 5:08 pm yesterday. Very upset, stated he is using the [facility] patch as a trial and he needs his meds.  Completely out and it is making him very antsy.Asking for [name] to call him directly [phone]. Apt 3/4

## 2023-11-12 ENCOUNTER — Encounter: Payer: Self-pay | Admitting: Adult Health

## 2023-11-12 ENCOUNTER — Telehealth: Payer: BC Managed Care – PPO | Admitting: Adult Health

## 2023-11-12 DIAGNOSIS — F902 Attention-deficit hyperactivity disorder, combined type: Secondary | ICD-10-CM

## 2023-11-12 DIAGNOSIS — F319 Bipolar disorder, unspecified: Secondary | ICD-10-CM | POA: Diagnosis not present

## 2023-11-12 DIAGNOSIS — F411 Generalized anxiety disorder: Secondary | ICD-10-CM | POA: Diagnosis not present

## 2023-11-12 MED ORDER — ARIPIPRAZOLE 5 MG PO TABS
5.0000 mg | ORAL_TABLET | Freq: Every day | ORAL | 1 refills | Status: DC
Start: 2023-11-12 — End: 2024-05-14

## 2023-11-12 MED ORDER — CLONIDINE HCL 0.2 MG PO TABS: 0.2000 mg | ORAL_TABLET | Freq: Every day | ORAL | 1 refills | Status: DC

## 2023-11-12 MED ORDER — AMPHETAMINE-DEXTROAMPHETAMINE 30 MG PO TABS: 30.0000 mg | ORAL_TABLET | Freq: Two times a day (BID) | ORAL | 0 refills | Status: DC

## 2023-11-12 MED ORDER — AMPHETAMINE-DEXTROAMPHETAMINE 30 MG PO TABS
30.0000 mg | ORAL_TABLET | Freq: Two times a day (BID) | ORAL | 0 refills | Status: DC
Start: 2024-01-07 — End: 2024-02-12

## 2023-11-12 MED ORDER — ESCITALOPRAM OXALATE 20 MG PO TABS: ORAL_TABLET | ORAL | 3 refills | Status: DC

## 2023-11-12 NOTE — Progress Notes (Signed)
 Norman Abbott [id number] 1986-01-24 38 y.o.  Virtual Visit via Video Note  I connected with pt @ on 11/12/23 at  9:30 AM EST by a video enabled telemedicine application and verified that I am speaking with the correct person using two identifiers.   I discussed the limitations of evaluation and management by telemedicine and the availability of in person appointments. The patient expressed understanding and agreed to proceed.  I discussed the assessment and treatment plan with the patient. The patient was provided an opportunity to ask questions and all were answered. The patient agreed with the plan and demonstrated an understanding of the instructions.   The patient was advised to call back or seek an in-person evaluation if the symptoms worsen or if the condition fails to improve as anticipated.  I provided 20 minutes of non-face-to-face time during this encounter.  The patient was located at home.  The provider was located at [facility].   Dorothyann Gibbs, NP   Subjective:   Patient ID:  Norman Abbott is a 38 y.o. (DOB 06-07-1986) male.  Chief Complaint: No chief complaint on file.  HPI Norman Abbott presents for follow-up of ADHD, PTSD, anxiety, insomnia.  Describes mood today as "ok". Pleasant. Denies tearfulness. Mood symptoms - denies depression. Reports stable interest and motivation. Reports anxiety all the time. Report irritability - "getting frustrated". Denies panic attacks. Reports worry, rumination, and over thinking. Mood is stable - reports "triggers". Stating "for the most part, I feel like I could do better". Reports taking medications as prescribed.  Taking medications as prescribed.  Energy levels stable. Active, has a regular exercise routine - 5 days a week. Enjoys some usual interests and activities. Married. Lives with wife 2 children and emotional support dog. Brother local.  Appetite adequate. Weight stable - 189 pounds. Sleeps well most nights.  Averages 6 hours. Focus and concentration stable. Completing tasks. Managing aspects of household. Self employed. Receiving disability benefits from [employer]. Denies SI or HI.  Denies AH or VH. Denies self harm. Denies substance use.   Review of Systems:  Review of Systems  Musculoskeletal:  Negative for gait problem.  Neurological:  Negative for tremors.  Psychiatric/Behavioral:         Please refer to HPI    Medications: I have reviewed the patient's current medications.  Current Outpatient Medications  Medication Sig Dispense Refill   [START ON 12/10/2023] amphetamine-dextroamphetamine (ADDERALL) 30 MG tablet Take 1 tablet by mouth 2 (two) times daily. 60 tablet 0   amphetamine-dextroamphetamine (ADDERALL) 30 MG tablet Take 1 tablet by mouth 2 (two) times daily. 60 tablet 0   [START ON 01/07/2024] amphetamine-dextroamphetamine (ADDERALL) 30 MG tablet Take 1 tablet by mouth 2 (two) times daily. 60 tablet 0   ARIPiprazole (ABILIFY) 5 MG tablet Take 1 tablet (5 mg total) by mouth daily. 90 tablet 1   atorvastatin (LIPITOR) 10 MG tablet TAKE 1 TABLET BY MOUTH ONCE DAILY     cloNIDine (CATAPRES) 0.2 MG tablet Take 1 tablet (0.2 mg total) by mouth at bedtime. 90 tablet 1   dicyclomine (BENTYL) 20 MG tablet Take 1 tablet (20 mg total) by mouth 3 (three) times daily as needed (abdominal pain). 30 tablet 0   escitalopram (LEXAPRO) 20 MG tablet TAKE 1 TABLET BY MOUTH EVERY DAY 90 tablet 3   No current facility-administered medications for this visit.    Medication Side Effects: None  Allergies: No Known Allergies  Past Medical History:  Diagnosis Date  ADHD    Hyperlipidemia    PTSD (post-traumatic stress disorder)     No family history on file.  Social History   Socioeconomic History   Marital status: Married    Spouse name: Not on file   Number of children: Not on file   Years of education: Not on file   Highest education level: Not on file  Occupational History   Not  on file  Tobacco Use   Smoking status: Never   Smokeless tobacco: Never  Vaping Use   Vaping status: Never Used  Substance and Sexual Activity   Alcohol use: Not on file   Drug use: Not on file   Sexual activity: Not on file  Other Topics Concern   Not on file  Social History Narrative   Not on file   Social Drivers of Health   Financial Resource Strain: Not on file  Food Insecurity: No Food Insecurity (08/15/2023)   Received from [facility]   Hunger Vital Sign    Worried About Running Out of Food in the Last Year: Never true    Ran Out of Food in the Last Year: Never true  Transportation Needs: No Transportation Needs (08/15/2023)   Received from [facility]    In the past 12 months, has lack of transportation kept you from medical appointments or from getting medications?: No    Lack of Transportation (Non-Medical): No  Physical Activity: Not on file  Stress: Not on file  Social Connections: Not on file  Intimate Partner Violence: Not on file   Past Medical History, Surgical history, Social history, and Family history were reviewed and updated as appropriate.   Please see review of systems for further details on the patient's review from today.   Objective:   Physical Exam:  There were no vitals taken for this visit.  Physical Exam Constitutional:      General: He is not in acute distress. Musculoskeletal:        General: No deformity.  Neurological:     Mental Status: He is alert and oriented to person, place, and time.     Coordination: Coordination normal.  Psychiatric:        Attention and Perception: Attention and perception normal. He does not perceive auditory or visual hallucinations.        Mood and Affect: Affect is not labile, blunt, angry or inappropriate.        Speech: Speech normal.        Behavior: Behavior normal.        Thought Content: Thought content normal. Thought content is  not paranoid or delusional. Thought content does not include homicidal or suicidal ideation. Thought content does not include homicidal or suicidal plan.        Cognition and Memory: Cognition and memory normal.        Judgment: Judgment normal.     Comments: Insight intact     Lab Review:     Component Value Date/Time   NA 139 06/09/2018 1715   K 4.0 06/09/2018 1715   CL 107 06/09/2018 1715   CO2 26 06/09/2018 1715   GLUCOSE 116 (H) 06/09/2018 1715   BUN 15 06/09/2018 1715   CREATININE 1.41 (H) 06/09/2018 1715   CALCIUM 8.6 (L) 06/09/2018 1715   PROT 7.3 06/09/2018 1715   ALBUMIN 3.5 06/09/2018 1715   AST 32 06/09/2018 1715   ALT 40 06/09/2018 1715   ALKPHOS  50 06/09/2018 1715   BILITOT 0.6 06/09/2018 1715   GFRNONAA >60 06/09/2018 1715   GFRAA >60 06/09/2018 1715       Component Value Date/Time   WBC 12.3 (H) 06/09/2018 1715   RBC 4.92 06/09/2018 1715   HGB 13.6 06/09/2018 1715   HCT 41.0 06/09/2018 1715   PLT 439 06/09/2018 1715   MCV 83.4 06/09/2018 1715   MCH 27.7 06/09/2018 1715   MCHC 33.2 06/09/2018 1715   RDW 14.4 06/09/2018 1715    No results found for: "POCLITH", "LITHIUM"   No results found for: "PHENYTOIN", "PHENOBARB", "VALPROATE", "CBMZ"   .res Assessment: Plan:    Plan:  Abilify 5mg  daily  Lexapro 20mg  to 10mg  daily Clonidine 0.2mg  at bedtime  Adderall 30mg  BID   D/C Gabapentin 300mg  at hs  Continue therapy  Receiving disability benefits - [employer].  RTC 3 months  20 minutes spent dedicated to the care of this patient on the date of this encounter to include pre-visit review of records, ordering of medication, post visit documentation, and face-to-face time with the patient discussing ADHD, PTSD, anxiety, and insomnia. Discussed continuing current medication regimen.  Discussed potential metabolic side effects associated with atypical antipsychotics, as well as potential risk for movement side effects. Advised pt to contact office if  movement side effects occur.   Discussed potential benefits, risks, and side effects of stimulants with patient to include increased heart rate, palpitations, insomnia, increased anxiety, increased irritability, or decreased appetite.  Instructed patient to contact office if experiencing any significant tolerability issues  Diagnoses and all orders for this visit:  Attention deficit hyperactivity disorder (ADHD), combined type -     amphetamine-dextroamphetamine (ADDERALL) 30 MG tablet; Take 1 tablet by mouth 2 (two) times daily.  Bipolar I disorder (HCC) -     ARIPiprazole (ABILIFY) 5 MG tablet; Take 1 tablet (5 mg total) by mouth daily. -     cloNIDine (CATAPRES) 0.2 MG tablet; Take 1 tablet (0.2 mg total) by mouth at bedtime.  Generalized anxiety disorder -     escitalopram (LEXAPRO) 20 MG tablet; TAKE 1 TABLET BY MOUTH EVERY DAY -     amphetamine-dextroamphetamine (ADDERALL) 30 MG tablet; Take 1 tablet by mouth 2 (two) times daily. -     amphetamine-dextroamphetamine (ADDERALL) 30 MG tablet; Take 1 tablet by mouth 2 (two) times daily.     Please see After Visit Summary for patient specific instructions.  No future appointments.   No orders of the defined types were placed in this encounter.     -------------------------------

## 2024-02-12 ENCOUNTER — Encounter: Payer: Self-pay | Admitting: Adult Health

## 2024-02-12 ENCOUNTER — Telehealth: Admitting: Adult Health

## 2024-02-12 DIAGNOSIS — F411 Generalized anxiety disorder: Secondary | ICD-10-CM

## 2024-02-12 DIAGNOSIS — F419 Anxiety disorder, unspecified: Secondary | ICD-10-CM | POA: Diagnosis not present

## 2024-02-12 DIAGNOSIS — G4700 Insomnia, unspecified: Secondary | ICD-10-CM

## 2024-02-12 DIAGNOSIS — F902 Attention-deficit hyperactivity disorder, combined type: Secondary | ICD-10-CM

## 2024-02-12 DIAGNOSIS — F909 Attention-deficit hyperactivity disorder, unspecified type: Secondary | ICD-10-CM | POA: Diagnosis not present

## 2024-02-12 DIAGNOSIS — F4310 Post-traumatic stress disorder, unspecified: Secondary | ICD-10-CM | POA: Diagnosis not present

## 2024-02-12 MED ORDER — AMPHETAMINE-DEXTROAMPHETAMINE 30 MG PO TABS
30.0000 mg | ORAL_TABLET | Freq: Two times a day (BID) | ORAL | 0 refills | Status: DC
Start: 2024-04-08 — End: 2024-05-14

## 2024-02-12 MED ORDER — AMPHETAMINE-DEXTROAMPHETAMINE 30 MG PO TABS
30.0000 mg | ORAL_TABLET | Freq: Two times a day (BID) | ORAL | 0 refills | Status: DC
Start: 2024-03-11 — End: 2024-05-14

## 2024-02-12 MED ORDER — AMPHETAMINE-DEXTROAMPHETAMINE 30 MG PO TABS
30.0000 mg | ORAL_TABLET | Freq: Two times a day (BID) | ORAL | 0 refills | Status: DC
Start: 2024-02-12 — End: 2024-05-14

## 2024-02-12 NOTE — Progress Notes (Signed)
 Norman Abbott [id number] 04-Jan-1986 38 y.o.  Virtual Visit via Video Note  I connected with pt @ on 02/12/24 at  9:00 AM EDT by a video enabled telemedicine application and verified that I am speaking with the correct person using two identifiers.   I discussed the limitations of evaluation and management by telemedicine and the availability of in person appointments. The patient expressed understanding and agreed to proceed.  I discussed the assessment and treatment plan with the patient. The patient was provided an opportunity to ask questions and all were answered. The patient agreed with the plan and demonstrated an understanding of the instructions.   The patient was advised to call back or seek an in-person evaluation if the symptoms worsen or if the condition fails to improve as anticipated.  I provided 25 minutes of non-face-to-face time during this encounter.  The patient was located at home.  The provider was located at [facility].   Norman Camera, NP   Subjective:   Patient ID:  Norman Abbott is a 38 y.o. (DOB 12-May-1986) male.  Chief Complaint: No chief complaint on file.   HPI Norman Abbott presents for follow-up of ADHD, PTSD, anxiety, insomnia.  Describes mood today as "ok". Pleasant. Denies tearfulness. Mood symptoms - denies depression, anxiety and irritability. Reports stable interest and motivation. Denies panic attacks. Reports some worry, rumination, and over thinking. Reports mood as stable. Stating "I feel like I'm doing better".  Reports taking medications as prescribed. Taking medications as prescribed.  Energy levels stable. Active, has a regular exercise routine - 5 days a week. Enjoys some usual interests and activities. Married. Lives with wife 2 children and emotional support dog. Brother local.  Appetite adequate. Weight gain - 196 pounds. Sleeps well most nights. Averages 6 to 7 hours. Focus and concentration stable. Completing tasks.  Managing aspects of household. Self employed. Receiving disability benefits from [employer]. Denies SI or HI.  Denies AH or VH. Denies self harm. Denies substance use.  Review of Systems:  Review of Systems  Musculoskeletal:  Negative for gait problem.  Neurological:  Negative for tremors.  Psychiatric/Behavioral:         Please refer to HPI    Medications: I have reviewed the patient's current medications.  Current Outpatient Medications  Medication Sig Dispense Refill   amphetamine -dextroamphetamine  (ADDERALL) 30 MG tablet Take 1 tablet by mouth 2 (two) times daily. 60 tablet 0   amphetamine -dextroamphetamine  (ADDERALL) 30 MG tablet Take 1 tablet by mouth 2 (two) times daily. 60 tablet 0   amphetamine -dextroamphetamine  (ADDERALL) 30 MG tablet Take 1 tablet by mouth 2 (two) times daily. 60 tablet 0   ARIPiprazole  (ABILIFY ) 5 MG tablet Take 1 tablet (5 mg total) by mouth daily. 90 tablet 1   atorvastatin (LIPITOR) 10 MG tablet TAKE 1 TABLET BY MOUTH ONCE DAILY     cloNIDine  (CATAPRES ) 0.2 MG tablet Take 1 tablet (0.2 mg total) by mouth at bedtime. 90 tablet 1   dicyclomine  (BENTYL ) 20 MG tablet Take 1 tablet (20 mg total) by mouth 3 (three) times daily as needed (abdominal pain). 30 tablet 0   escitalopram  (LEXAPRO ) 20 MG tablet TAKE 1 TABLET BY MOUTH EVERY DAY 90 tablet 3   No current facility-administered medications for this visit.    Medication Side Effects: None  Allergies: No Known Allergies  Past Medical History:  Diagnosis Date   ADHD    Hyperlipidemia    PTSD (post-traumatic stress disorder)  No family history on file.  Social History   Socioeconomic History   Marital status: Married    Spouse name: Not on file   Number of children: Not on file   Years of education: Not on file   Highest education level: Not on file  Occupational History   Not on file  Tobacco Use   Smoking status: Never   Smokeless tobacco: Never  Vaping Use   Vaping status: Never  Used  Substance and Sexual Activity   Alcohol use: Not on file   Drug use: Not on file   Sexual activity: Not on file  Other Topics Concern   Not on file  Social History Narrative   Not on file   Social Drivers of Health   Financial Resource Strain: Not on file  Food Insecurity: No Food Insecurity (08/15/2023)   Received from [facility]   Hunger Vital Sign    Worried About Running Out of Food in the Last Year: Never true    Ran Out of Food in the Last Year: Never true  Transportation Needs: No Transportation Needs (08/15/2023)   Received from [facility]    In the past 12 months, has lack of transportation kept you from medical appointments or from getting medications?: No    Lack of Transportation (Non-Medical): No  Physical Activity: Not on file  Stress: Not on file  Social Connections: Not on file  Intimate Partner Violence: Not on file    Past Medical History, Surgical history, Social history, and Family history were reviewed and updated as appropriate.   Please see review of systems for further details on the patient's review from today.   Objective:   Physical Exam:  There were no vitals taken for this visit.  Physical Exam Constitutional:      General: He is not in acute distress. Musculoskeletal:        General: No deformity.  Neurological:     Mental Status: He is alert and oriented to person, place, and time.     Coordination: Coordination normal.  Psychiatric:        Attention and Perception: Attention and perception normal. He does not perceive auditory or visual hallucinations.        Mood and Affect: Mood normal. Mood is not anxious or depressed. Affect is not labile, blunt, angry or inappropriate.        Speech: Speech normal.        Behavior: Behavior normal.        Thought Content: Thought content normal. Thought content is not paranoid or delusional. Thought content does not include  homicidal or suicidal ideation. Thought content does not include homicidal or suicidal plan.        Cognition and Memory: Cognition and memory normal.        Judgment: Judgment normal.     Comments: Insight intact     Lab Review:     Component Value Date/Time   NA 139 06/09/2018 1715   K 4.0 06/09/2018 1715   CL 107 06/09/2018 1715   CO2 26 06/09/2018 1715   GLUCOSE 116 (H) 06/09/2018 1715   BUN 15 06/09/2018 1715   CREATININE 1.41 (H) 06/09/2018 1715   CALCIUM 8.6 (L) 06/09/2018 1715   PROT 7.3 06/09/2018 1715   ALBUMIN 3.5 06/09/2018 1715   AST 32 06/09/2018 1715   ALT 40 06/09/2018 1715   ALKPHOS 50 06/09/2018 1715   BILITOT 0.6  06/09/2018 1715   GFRNONAA >60 06/09/2018 1715   GFRAA >60 06/09/2018 1715       Component Value Date/Time   WBC 12.3 (H) 06/09/2018 1715   RBC 4.92 06/09/2018 1715   HGB 13.6 06/09/2018 1715   HCT 41.0 06/09/2018 1715   PLT 439 06/09/2018 1715   MCV 83.4 06/09/2018 1715   MCH 27.7 06/09/2018 1715   MCHC 33.2 06/09/2018 1715   RDW 14.4 06/09/2018 1715    No results found for: "POCLITH", "LITHIUM"   No results found for: "PHENYTOIN", "PHENOBARB", "VALPROATE", "CBMZ"   .res Assessment: Plan:    Plan:  Abilify  5mg  daily  Lexapro  10mg  daily Clonidine  0.2mg  at bedtime  Adderall 30mg  BID - 120/76  Continue therapy  Receiving disability benefits - [employer].  RTC 3 months  25 minutes spent dedicated to the care of this patient on the date of this encounter to include pre-visit review of records, ordering of medication, post visit documentation, and face-to-face time with the patient discussing ADHD, PTSD, anxiety and insomnia. Discussed continuing current medication regimen.  Discussed potential metabolic side effects associated with atypical antipsychotics, as well as potential risk for movement side effects. Advised pt to contact office if movement side effects occur.   Discussed potential benefits, risks, and side effects of  stimulants with patient to include increased heart rate, palpitations, insomnia, increased anxiety, increased irritability, or decreased appetite.  Instructed patient to contact office if experiencing any significant tolerability issues  There are no diagnoses linked to this encounter.   Please see After Visit Summary for patient specific instructions.  Future Appointments  Date Time Provider Department Center  02/12/2024  9:00 AM Mahala Rommel Nattalie, NP [facility] None    No orders of the defined types were placed in this encounter.     -------------------------------

## 2024-05-14 ENCOUNTER — Telehealth: Admitting: Adult Health

## 2024-05-14 ENCOUNTER — Encounter: Payer: Self-pay | Admitting: Adult Health

## 2024-05-14 DIAGNOSIS — F4310 Post-traumatic stress disorder, unspecified: Secondary | ICD-10-CM

## 2024-05-14 DIAGNOSIS — G4700 Insomnia, unspecified: Secondary | ICD-10-CM | POA: Diagnosis not present

## 2024-05-14 DIAGNOSIS — F411 Generalized anxiety disorder: Secondary | ICD-10-CM | POA: Diagnosis not present

## 2024-05-14 DIAGNOSIS — F902 Attention-deficit hyperactivity disorder, combined type: Secondary | ICD-10-CM | POA: Diagnosis not present

## 2024-05-14 MED ORDER — CLONIDINE HCL 0.2 MG PO TABS: 0.2000 mg | ORAL_TABLET | Freq: Every day | ORAL | 0 refills | Status: DC

## 2024-05-14 MED ORDER — ESCITALOPRAM OXALATE 10 MG PO TABS: ORAL_TABLET | ORAL | 0 refills | Status: DC

## 2024-05-14 MED ORDER — AMPHETAMINE-DEXTROAMPHETAMINE 30 MG PO TABS: 30.0000 mg | ORAL_TABLET | Freq: Two times a day (BID) | ORAL | 0 refills | Status: DC

## 2024-05-14 MED ORDER — ARIPIPRAZOLE 5 MG PO TABS: 5.0000 mg | ORAL_TABLET | Freq: Every day | ORAL | 0 refills | Status: DC

## 2024-05-14 NOTE — Progress Notes (Signed)
 Norman Abbott [id number] 16-Jun-1986 38 y.o.  Virtual Visit via Video Note  I connected with pt @ on 05/14/24 at  9:00 AM EDT by a video enabled telemedicine application and verified that I am speaking with the correct person using two identifiers.   I discussed the limitations of evaluation and management by telemedicine and the availability of in person appointments. The patient expressed understanding and agreed to proceed.  I discussed the assessment and treatment plan with the patient. The patient was provided an opportunity to ask questions and all were answered. The patient agreed with the plan and demonstrated an understanding of the instructions.   The patient was advised to call back or seek an in-person evaluation if the symptoms worsen or if the condition fails to improve as anticipated.  I provided 25 minutes of non-face-to-face time during this encounter.  The patient was located at home.  The provider was located at [facility].   Angeline LOISE Sayers, NP   Subjective:   Patient ID:  Norman Abbott is a 38 y.o. (DOB 17-Jan-1986) male.  Chief Complaint: No chief complaint on file.   HPI Norman Abbott presents for follow-up of ADHD, PTSD, anxiety, insomnia.  Describes mood today as ok. Pleasant. Denies tearfulness. Mood symptoms - denies depression, anxiety and irritability. Reports stable interest and motivation. Denies panic attacks. Denies worry, rumination and over thinking. Reports mood as stable. Stating I feel like I'm doing ok. Reports taking medications as prescribed. Taking medications as prescribed.  Energy levels stable. Active, has a regular exercise routine - 5 days a week. Enjoys some usual interests and activities. Married. Lives with wife 2 children and emotional support dog. Brother local.  Appetite adequate. Weight loss - 180 from 196 pounds. Sleeps well most nights. Averages 6 to 7 hours. Focus and concentration stable - pretty decent.  Completing tasks - making lists and checking it off. Managing aspects of household. Self employed. Receiving disability benefits from [employer]. Denies SI or HI.  Denies AH or VH. Denies self harm. Denies substance use.   Review of Systems:  Review of Systems  Musculoskeletal:  Negative for gait problem.  Neurological:  Negative for tremors.  Psychiatric/Behavioral:         Please refer to HPI    Medications: I have reviewed the patient's current medications.  Current Outpatient Medications  Medication Sig Dispense Refill   amphetamine -dextroamphetamine  (ADDERALL) 30 MG tablet Take 1 tablet by mouth 2 (two) times daily. 60 tablet 0   [START ON 06/11/2024] amphetamine -dextroamphetamine  (ADDERALL) 30 MG tablet Take 1 tablet by mouth 2 (two) times daily. 60 tablet 0   [START ON 07/09/2024] amphetamine -dextroamphetamine  (ADDERALL) 30 MG tablet Take 1 tablet by mouth 2 (two) times daily. 60 tablet 0   ARIPiprazole  (ABILIFY ) 5 MG tablet Take 1 tablet (5 mg total) by mouth daily. 90 tablet 0   atorvastatin (LIPITOR) 10 MG tablet TAKE 1 TABLET BY MOUTH ONCE DAILY     cloNIDine  (CATAPRES ) 0.2 MG tablet Take 1 tablet (0.2 mg total) by mouth at bedtime. 90 tablet 0   dicyclomine  (BENTYL ) 20 MG tablet Take 1 tablet (20 mg total) by mouth 3 (three) times daily as needed (abdominal pain). 30 tablet 0   escitalopram  (LEXAPRO ) 10 MG tablet TAKE 1 TABLET BY MOUTH EVERY DAY 90 tablet 0   No current facility-administered medications for this visit.    Medication Side Effects: None  Allergies: No Known Allergies  Past Medical History:  Diagnosis Date  ADHD    Hyperlipidemia    PTSD (post-traumatic stress disorder)     No family history on file.  Social History   Socioeconomic History   Marital status: Married    Spouse name: Not on file   Number of children: Not on file   Years of education: Not on file   Highest education level: Not on file  Occupational History   Not on file   Tobacco Use   Smoking status: Never   Smokeless tobacco: Never  Vaping Use   Vaping status: Never Used  Substance and Sexual Activity   Alcohol use: Not on file   Drug use: Not on file   Sexual activity: Not on file  Other Topics Concern   Not on file  Social History Narrative   Not on file   Social Drivers of Health   Financial Resource Strain: Not on file  Food Insecurity: No Food Insecurity (08/15/2023)   Received from [facility]   Hunger Vital Sign    Within the past 12 months, you worried that your food would run out before you got the money to buy more.: Never true    Within the past 12 months, the food you bought just didn't last and you didn't have money to get more.: Never true  Transportation Needs: No Transportation Needs (08/15/2023)   Received from [facility] - Transportation    In the past 12 months, has lack of transportation kept you from medical appointments or from getting medications?: No    Lack of Transportation (Non-Medical): No  Physical Activity: Not on file  Stress: Not on file  Social Connections: Not on file  Intimate Partner Violence: Not on file    Past Medical History, Surgical history, Social history, and Family history were reviewed and updated as appropriate.   Please see review of systems for further details on the patient's review from today.   Objective:   Physical Exam:  There were no vitals taken for this visit.  Physical Exam Constitutional:      General: He is not in acute distress. Musculoskeletal:        General: No deformity.  Neurological:     Mental Status: He is alert and oriented to person, place, and time.     Coordination: Coordination normal.  Psychiatric:        Attention and Perception: Attention and perception normal. He does not perceive auditory or visual hallucinations.        Mood and Affect: Mood normal. Mood is not anxious or depressed. Affect is not labile,  blunt, angry or inappropriate.        Speech: Speech normal.        Behavior: Behavior normal.        Thought Content: Thought content normal. Thought content is not paranoid or delusional. Thought content does not include homicidal or suicidal ideation. Thought content does not include homicidal or suicidal plan.        Cognition and Memory: Cognition and memory normal.        Judgment: Judgment normal.     Comments: Insight intact     Lab Review:     Component Value Date/Time   NA 139 06/09/2018 1715   K 4.0 06/09/2018 1715   CL 107 06/09/2018 1715   CO2 26 06/09/2018 1715   GLUCOSE 116 (H) 06/09/2018 1715   BUN 15 06/09/2018 1715   CREATININE 1.41 (H) 06/09/2018 1715  CALCIUM 8.6 (L) 06/09/2018 1715   PROT 7.3 06/09/2018 1715   ALBUMIN 3.5 06/09/2018 1715   AST 32 06/09/2018 1715   ALT 40 06/09/2018 1715   ALKPHOS 50 06/09/2018 1715   BILITOT 0.6 06/09/2018 1715   GFRNONAA >60 06/09/2018 1715   GFRAA >60 06/09/2018 1715       Component Value Date/Time   WBC 12.3 (H) 06/09/2018 1715   RBC 4.92 06/09/2018 1715   HGB 13.6 06/09/2018 1715   HCT 41.0 06/09/2018 1715   PLT 439 06/09/2018 1715   MCV 83.4 06/09/2018 1715   MCH 27.7 06/09/2018 1715   MCHC 33.2 06/09/2018 1715   RDW 14.4 06/09/2018 1715    No results found for: POCLITH, LITHIUM   No results found for: PHENYTOIN, PHENOBARB, VALPROATE, CBMZ   .res Assessment: Plan:    Plan:  Abilify  5mg  daily  Lexapro  10mg  daily Clonidine  0.2mg  at bedtime  Adderall 30mg  BID - 120/76  Continue therapy  Receiving disability benefits - [employer].  RTC 3 months  25 minutes spent dedicated to the care of this patient on the date of this encounter to include pre-visit review of records, ordering of medication, post visit documentation, and face-to-face time with the patient discussing ADHD, PTSD, anxiety and insomnia. Discussed continuing current medication regimen.  Discussed potential metabolic side  effects associated with atypical antipsychotics, as well as potential risk for movement side effects. Advised pt to contact office if movement side effects occur.   Discussed potential benefits, risks, and side effects of stimulants with patient to include increased heart rate, palpitations, insomnia, increased anxiety, increased irritability, or decreased appetite.  Instructed patient to contact office if experiencing any significant tolerability issues  Diagnoses and all orders for this visit:  Attention deficit hyperactivity disorder (ADHD), combined type -     amphetamine -dextroamphetamine  (ADDERALL) 30 MG tablet; Take 1 tablet by mouth 2 (two) times daily.  Generalized anxiety disorder -     escitalopram  (LEXAPRO ) 10 MG tablet; TAKE 1 TABLET BY MOUTH EVERY DAY -     amphetamine -dextroamphetamine  (ADDERALL) 30 MG tablet; Take 1 tablet by mouth 2 (two) times daily. -     amphetamine -dextroamphetamine  (ADDERALL) 30 MG tablet; Take 1 tablet by mouth 2 (two) times daily.  Insomnia, unspecified type  PTSD (post-traumatic stress disorder)  Other orders -     ARIPiprazole  (ABILIFY ) 5 MG tablet; Take 1 tablet (5 mg total) by mouth daily. -     cloNIDine  (CATAPRES ) 0.2 MG tablet; Take 1 tablet (0.2 mg total) by mouth at bedtime.     Please see After Visit Summary for patient specific instructions.  No future appointments.   No orders of the defined types were placed in this encounter.     -------------------------------

## 2024-06-04 ENCOUNTER — Other Ambulatory Visit: Payer: Self-pay | Admitting: Adult Health

## 2024-06-04 DIAGNOSIS — F319 Bipolar disorder, unspecified: Secondary | ICD-10-CM

## 2024-08-13 ENCOUNTER — Encounter: Payer: Self-pay | Admitting: Adult Health

## 2024-08-13 ENCOUNTER — Telehealth: Admitting: Adult Health

## 2024-08-13 DIAGNOSIS — F411 Generalized anxiety disorder: Secondary | ICD-10-CM

## 2024-08-13 DIAGNOSIS — F4310 Post-traumatic stress disorder, unspecified: Secondary | ICD-10-CM

## 2024-08-13 DIAGNOSIS — G4700 Insomnia, unspecified: Secondary | ICD-10-CM

## 2024-08-13 DIAGNOSIS — F902 Attention-deficit hyperactivity disorder, combined type: Secondary | ICD-10-CM

## 2024-08-13 MED ORDER — ESCITALOPRAM OXALATE 10 MG PO TABS: ORAL_TABLET | ORAL | 0 refills | Status: AC

## 2024-08-13 MED ORDER — AMPHETAMINE-DEXTROAMPHETAMINE 30 MG PO TABS: 30.0000 mg | ORAL_TABLET | Freq: Two times a day (BID) | ORAL | 0 refills | Status: AC

## 2024-08-13 MED ORDER — ARIPIPRAZOLE 5 MG PO TABS: 5.0000 mg | ORAL_TABLET | Freq: Every day | ORAL | 0 refills | Status: AC

## 2024-08-13 MED ORDER — CLONIDINE HCL 0.2 MG PO TABS: 0.2000 mg | ORAL_TABLET | Freq: Every day | ORAL | 0 refills | Status: AC

## 2024-08-13 NOTE — Progress Notes (Signed)
 GYAN Abbott [id number] [birth year] 38 y.o.  Virtual Visit via Video Note  I connected with pt @ on 08/13/24 at  9:30 AM EST by a video enabled telemedicine application and verified that I am speaking with the correct person using two identifiers.   I discussed the limitations of evaluation and management by telemedicine and the availability of in person appointments. The patient expressed understanding and agreed to proceed.  I discussed the assessment and treatment plan with the patient. The patient was provided an opportunity to ask questions and all were answered. The patient agreed with the plan and demonstrated an understanding of the instructions.   The patient was advised to call back or seek an in-person evaluation if the symptoms worsen or if the condition fails to improve as anticipated.  I provided 25 minutes of non-face-to-face time during this encounter.  The patient was located at home.  The provider was located at [facility].   Angeline LOISE Sayers, NP   Subjective:   Patient ID:  Norman Abbott is a 38 y.o. (DOB [birth year]) male.  Chief Complaint: No chief complaint on file.   HPI Norman Abbott presents for follow-up of ADHD, PTSD, anxiety, insomnia.  Describes mood today as ok. Pleasant. Denies tearfulness. Mood symptoms - denies depression, anxiety and irritability. Reports stable interest and motivation. Denies panic attacks. Denies worry, rumination and over thinking. Reports mood as stable. Stating I feel like I'm doing pretty good. Reports taking medications as prescribed. Taking medications as prescribed.  Energy levels stable. Active, has a regular exercise routine - 5 days a week. Enjoys some usual interests and activities. Married. Lives with wife 2 children and emotional support dog. Brother local.  Appetite adequate. Weight loss - 183 pounds. Sleeps well most nights. Averages 6 to 7 hours. Has added magnesium for sleep. Focus and concentration  stable. Completing tasks. Managing aspects of household. Self employed. Receiving disability benefits from [employer]. Denies SI or HI.  Denies AH or VH. Denies self harm. Denies substance use.   Review of Systems:  Review of Systems  Musculoskeletal:  Negative for gait problem.  Neurological:  Negative for tremors.  Psychiatric/Behavioral:         Please refer to HPI    Medications: I have reviewed the patient's current medications.  Current Outpatient Medications  Medication Sig Dispense Refill   amphetamine -dextroamphetamine  (ADDERALL) 30 MG tablet Take 1 tablet by mouth 2 (two) times daily. 60 tablet 0   amphetamine -dextroamphetamine  (ADDERALL) 30 MG tablet Take 1 tablet by mouth 2 (two) times daily. 60 tablet 0   amphetamine -dextroamphetamine  (ADDERALL) 30 MG tablet Take 1 tablet by mouth 2 (two) times daily. 60 tablet 0   ARIPiprazole  (ABILIFY ) 5 MG tablet Take 1 tablet (5 mg total) by mouth daily. 90 tablet 0   atorvastatin (LIPITOR) 10 MG tablet TAKE 1 TABLET BY MOUTH ONCE DAILY     cloNIDine  (CATAPRES ) 0.2 MG tablet Take 1 tablet (0.2 mg total) by mouth at bedtime. 90 tablet 0   dicyclomine  (BENTYL ) 20 MG tablet Take 1 tablet (20 mg total) by mouth 3 (three) times daily as needed (abdominal pain). 30 tablet 0   escitalopram  (LEXAPRO ) 10 MG tablet TAKE 1 TABLET BY MOUTH EVERY DAY 90 tablet 0   No current facility-administered medications for this visit.    Medication Side Effects: None  Allergies: No Known Allergies  Past Medical History:  Diagnosis Date   ADHD    Hyperlipidemia    PTSD (post-traumatic stress  disorder)     No family history on file.  Social History   Socioeconomic History   Marital status: Married    Spouse name: Not on file   Number of children: Not on file   Years of education: Not on file   Highest education level: Not on file  Occupational History   Not on file  Tobacco Use   Smoking status: Never   Smokeless tobacco: Never  Vaping  Use   Vaping status: Never Used  Substance and Sexual Activity   Alcohol use: Not on file   Drug use: Not on file   Sexual activity: Not on file  Other Topics Concern   Not on file  Social History Narrative   Not on file   Social Drivers of Health   Financial Resource Strain: Low Risk  (08/11/2024)   Received from [facility]   Overall Financial Resource Strain (CARDIA)    Difficulty of Paying Living Expenses: Not hard at all  Food Insecurity: No Food Insecurity (08/11/2024)   Received from [facility]   Hunger Vital Sign    Within the past 12 months, you worried that your food would run out before you got the money to buy more.: Never true    Within the past 12 months, the food you bought just didn't last and you didn't have money to get more.: Never true  Transportation Needs: No Transportation Needs (08/11/2024)   Received from [facility] - Transportation    In the past 12 months, has lack of transportation kept you from medical appointments or from getting medications?: No    Lack of Transportation (Non-Medical): No  Physical Activity: Not on file  Stress: Not on file  Social Connections: Not on file  Intimate Partner Violence: Not on file    Past Medical History, Surgical history, Social history, and Family history were reviewed and updated as appropriate.   Please see review of systems for further details on the patient's review from today.   Objective:   Physical Exam:  There were no vitals taken for this visit.  Physical Exam Constitutional:      General: He is not in acute distress. Musculoskeletal:        General: No deformity.  Neurological:     Mental Status: He is alert and oriented to person, place, and time.     Coordination: Coordination normal.  Psychiatric:        Attention and Perception: Attention and perception normal. He does not perceive auditory or visual hallucinations.         Mood and Affect: Mood normal. Mood is not anxious or depressed. Affect is not labile, blunt, angry or inappropriate.        Speech: Speech normal.        Behavior: Behavior normal.        Thought Content: Thought content normal. Thought content is not paranoid or delusional. Thought content does not include homicidal or suicidal ideation. Thought content does not include homicidal or suicidal plan.        Cognition and Memory: Cognition and memory normal.        Judgment: Judgment normal.     Comments: Insight intact     Lab Review:     Component Value Date/Time   NA 139 06/09/2018 1715   K 4.0 06/09/2018 1715   CL 107 06/09/2018 1715   CO2 26 06/09/2018 1715   GLUCOSE 116 (H)  06/09/2018 1715   BUN 15 06/09/2018 1715   CREATININE 1.41 (H) 06/09/2018 1715   CALCIUM 8.6 (L) 06/09/2018 1715   PROT 7.3 06/09/2018 1715   ALBUMIN 3.5 06/09/2018 1715   AST 32 06/09/2018 1715   ALT 40 06/09/2018 1715   ALKPHOS 50 06/09/2018 1715   BILITOT 0.6 06/09/2018 1715   GFRNONAA >60 06/09/2018 1715   GFRAA >60 06/09/2018 1715       Component Value Date/Time   WBC 12.3 (H) 06/09/2018 1715   RBC 4.92 06/09/2018 1715   HGB 13.6 06/09/2018 1715   HCT 41.0 06/09/2018 1715   PLT 439 06/09/2018 1715   MCV 83.4 06/09/2018 1715   MCH 27.7 06/09/2018 1715   MCHC 33.2 06/09/2018 1715   RDW 14.4 06/09/2018 1715    No results found for: POCLITH, LITHIUM   No results found for: PHENYTOIN, PHENOBARB, VALPROATE, CBMZ   .res Assessment: Plan:    Plan:  Abilify  5mg  daily  Lexapro  10mg  daily Clonidine  0.2mg  at bedtime  Adderall 30mg  BID - 120/76  Continue therapy  Receiving disability benefits - [employer].  RTC 3 months  25 minutes spent dedicated to the care of this patient on the date of this encounter to include pre-visit review of records, ordering of medication, post visit documentation, and face-to-face time with the patient discussing ADHD, PTSD, anxiety and insomnia.  Discussed continuing current medication regimen.  Discussed potential metabolic side effects associated with atypical antipsychotics, as well as potential risk for movement side effects. Advised pt to contact office if movement side effects occur.   Discussed potential benefits, risks, and side effects of stimulants with patient to include increased heart rate, palpitations, insomnia, increased anxiety, increased irritability, or decreased appetite.  Instructed patient to contact office if experiencing any significant tolerability issues  There are no diagnoses linked to this encounter.   Please see After Visit Summary for patient specific instructions.  No future appointments.  No orders of the defined types were placed in this encounter.     -------------------------------

## 2024-11-11 ENCOUNTER — Telehealth: Admitting: Adult Health
